# Patient Record
Sex: Female | Born: 2000 | Race: Black or African American | Hispanic: No | Marital: Single | Smoking: Never smoker
Health system: Southern US, Community
[De-identification: ages and names within clinical notes are randomized; demographics above are authoritative.]

---
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
## 2021-05-08 ENCOUNTER — Ambulatory Visit (HOSPITAL_COMMUNITY)
Admission: EM | Admit: 2021-05-08 | Discharge: 2021-05-08 | Disposition: A | Discharge status: Home / Self Care | Payer: 59 | Attending: Sports Medicine | Admitting: Sports Medicine

## 2021-05-08 ENCOUNTER — Other Ambulatory Visit: Payer: Self-pay

## 2021-05-08 ENCOUNTER — Encounter (HOSPITAL_COMMUNITY): Payer: Self-pay | Admitting: Emergency Medicine

## 2021-05-08 DIAGNOSIS — J020 Streptococcal pharyngitis: Secondary | ICD-10-CM | POA: Diagnosis not present

## 2021-05-08 MED ORDER — KETOROLAC TROMETHAMINE 60 MG/2ML IM SOLN
60.0000 mg | Freq: Once | INTRAMUSCULAR | Status: AC
  Administered 2021-05-08: 60 mg via INTRAMUSCULAR

## 2021-05-08 MED ORDER — KETOROLAC TROMETHAMINE 60 MG/2ML IM SOLN
INTRAMUSCULAR | Status: AC
  Filled 2021-05-08: qty 2

## 2021-05-08 MED ORDER — AMOXICILLIN 500 MG PO CAPS: 500.0000 mg | ORAL_CAPSULE | Freq: Two times a day (BID) | ORAL | 0 refills | Status: AC

## 2021-05-08 NOTE — ED Provider Notes (Signed)
?[facility] ? ? ? ?CSN: [id number] ?Arrival date & time: 05/08/21  4098 ? ? ?  ? ?History   ?Chief Complaint ?Chief Complaint  ?Patient presents with  ? Sore Throat  ? ? ?HPI ?Brandy Morse is a 21 y.o. female here for sore throat ? ? ?Sore Throat ?Pertinent negatives include no chest pain, no abdominal pain and no shortness of breath.  ? ?Sore throat since yesterday, very painful.  She feels like her tonsils are swollen. ?Has had some subjective fever and chills, no fever here today ?Painful to swallow ?R > L  ?No runny nose, stuff nose ?No chest pain or shortness of breath  ?Denies any known sick contacts ?She does report a few years ago she did have strep throat/tonsillitis ?Has been painful to eat and drink, although she is able to tolerate liquids ? ?Tx: none so far ? ?History reviewed. No pertinent past medical history. ? ?Patient Active Problem List  ? Diagnosis Date Noted  ? Wellness examination 10/12/2020  ? ? ?History reviewed. No pertinent surgical history. ? ?OB History   ?No obstetric history on file. ?  ? ? ? ?Home Medications   ? ?Prior to Admission medications   ?Medication Sig Start Date End Date Taking? Authorizing Provider  ?amoxicillin (AMOXIL) 500 MG capsule Take 1 capsule (500 mg total) by mouth 2 (two) times daily for 10 days. 05/08/21 05/18/21 Yes Madelyn Brunner, DO  ?hydroxypropyl methylcellulose / hypromellose (ISOPTO TEARS / GONIOVISC) 2.5 % ophthalmic solution Place 1 drop into the left eye 4 (four) times daily as needed for dry eyes. ?Patient not taking: Reported on 05/08/2021 12/19/20   Dartha Lodge, PA-C  ? ? ?Family History ?Family History  ?Problem Relation Age of Onset  ? Multiple sclerosis Father   ? ? ?Social History ?Social History  ? ?Tobacco Use  ? Smoking status: Never  ? Smokeless tobacco: Never  ?Vaping Use  ? Vaping Use: Never used  ?Substance Use Topics  ? Alcohol use: Not Currently  ? Drug use: Never  ? ? ? ?Allergies   ?Patient has no known allergies. ? ? ?Review of  Systems ?Review of Systems  ?Constitutional:  Positive for appetite change.  ?HENT:  Positive for sore throat and trouble swallowing. Negative for congestion, drooling, sneezing and voice change.   ?Respiratory:  Negative for cough and shortness of breath.   ?Cardiovascular:  Negative for chest pain.  ?Gastrointestinal:  Negative for abdominal pain, diarrhea, nausea and vomiting.  ?Musculoskeletal:  Negative for neck stiffness.  ?Skin:  Negative for rash.  ? ? ?Physical Exam ?Triage Vital Signs ?ED Triage Vitals  ?Enc Vitals Group  ?   BP 05/08/21 0848 121/81  ?   Pulse Rate 05/08/21 0848 78  ?   Resp 05/08/21 0848 18  ?   Temp 05/08/21 0848 98.6 ?F (37 ?C)  ?   Temp Source 05/08/21 0848 Oral  ?   SpO2 05/08/21 0848 98 %  ?   Weight --   ?   Height --   ?   Head Circumference --   ?   Peak Flow --   ?   Pain Score 05/08/21 0846 10  ?   Pain Loc --   ?   Pain Edu? --   ?   Excl. in GC? --   ? ?No data found. ? ?Updated Vital Signs ?BP 121/81 (BP Location: Right Arm)   Pulse 78   Temp 98.6 ?F (37 ?C) (Oral)  Resp 18   LMP 05/02/2021 (Exact Date)   SpO2 98%  ? ?Visual Acuity ?Right Eye Distance:   ?Left Eye Distance:   ?Bilateral Distance:   ? ?Right Eye Near:   ?Left Eye Near:    ?Bilateral Near:    ? ?Physical Exam ?Constitutional:   ?   Appearance: She is ill-appearing. She is not toxic-appearing.  ?HENT:  ?   Head: Normocephalic and atraumatic.  ?   Right Ear: Tympanic membrane and ear canal normal.  ?   Left Ear: Tympanic membrane and ear canal normal.  ?   Nose: No congestion.  ?   Mouth/Throat:  ?   Mouth: Mucous membranes are moist.  ?   Pharynx: Posterior oropharyngeal erythema present.  ?   Comments: + Bilateral tonsillar swelling and erythema ?Tonsils 2-3+ ?+ exudate noted on the right tonsil ?Eyes:  ?   Extraocular Movements: Extraocular movements intact.  ?   Pupils: Pupils are equal, round, and reactive to light.  ?Cardiovascular:  ?   Rate and Rhythm: Normal rate.  ?Pulmonary:  ?   Effort:  Pulmonary effort is normal. No respiratory distress.  ?Abdominal:  ?   General: Abdomen is flat.  ?   Palpations: Abdomen is soft.  ?Musculoskeletal:  ?   Cervical back: Neck supple. No rigidity.  ?Lymphadenopathy:  ?   Cervical: Cervical adenopathy present.  ?Skin: ?   General: Skin is warm.  ?   Capillary Refill: Capillary refill takes less than 2 seconds.  ?Neurological:  ?   Mental Status: She is alert.  ?Psychiatric:     ?   Mood and Affect: Mood normal.     ?   Thought Content: Thought content normal.  ? ? ? ?UC Treatments / Results  ?Labs ?(all labs ordered are listed, but only abnormal results are displayed) ?Labs Reviewed - No data to display ? ?EKG ? ? ?Radiology ?No results found. ? ?Procedures ?Procedures (including critical care time) ? ?Medications Ordered in UC ?Medications  ?ketorolac (TORADOL) injection 60 mg (60 mg Intramuscular Given 05/08/21 0934)  ? ? ?Initial Impression / Assessment and Plan / UC Course  ?I have reviewed the triage vital signs and the nursing notes. ? ?Pertinent labs & imaging results that were available during my care of the patient were reviewed by me and considered in my medical decision making (see chart for details). ? ?  ? ?Patient presents with significant sore throat indicative of strep pharyngitis since yesterday.  Bilateral tonsils swollen and erythematous.  She does have exudates noted on the right tonsil.  Did not feel rapid strep testing was warranted as her Centor score is 4.  We will treat prophylactically with amoxicillin 500 mg twice daily for 10 days.  Given patient's hesitancy with swallowing giving her pain, I did proceed with IM injection of Toradol 60 mg today here in the office.  She may use over-the-counter anti-inflammatories by mouth starting tomorrow.  Supportive treatment discussed for her sore throat.  Strict return precautions provided. Safe for discharge home. ? ?Final Clinical Impressions(s) / UC Diagnoses  ? ?Final diagnoses:  ?Strep pharyngitis   ?Strep throat  ? ? ? ?Discharge Instructions   ? ?  ?Take amoxicillin, 1 tablet twice a day for the next 10 days.  Be sure that you complete your entire antibiotic course. ? ?Things at home you can do for your sore throat: ?-May take ibuprofen or Motrin every 6-8 hours as needed ?-You can gargle warm salt  water, use honey, use cough drops or throat lozenges ? ? ? ? ?ED Prescriptions   ? ? Medication Sig Dispense Auth. Provider  ? amoxicillin (AMOXIL) 500 MG capsule Take 1 capsule (500 mg total) by mouth 2 (two) times daily for 10 days. 20 capsule Madelyn BrunnerBrooks, Tylisha Danis, DO  ? ?  ? ?PDMP not reviewed this encounter. ?  Madelyn Brunner?Tenae Graziosi, DO ?05/08/21 [id number] ? ?

## 2021-05-08 NOTE — ED Triage Notes (Addendum)
Right side of throat hurts with swallowing, talking, movement of head.  Patient reports sudden onset of pain last night ? ?Patient is tearful ?

## 2021-05-08 NOTE — Discharge Instructions (Addendum)
Take amoxicillin, 1 tablet twice a day for the next 10 days.  Be sure that you complete your entire antibiotic course. ? ?Things at home you can do for your sore throat: ?-May take ibuprofen or Motrin every 6-8 hours as needed ?-You can gargle warm salt water, use honey, use cough drops or throat lozenges ?

## 2021-05-10 ENCOUNTER — Ambulatory Visit (INDEPENDENT_AMBULATORY_CARE_PROVIDER_SITE_OTHER): Payer: 59 | Admitting: Family Medicine

## 2021-05-10 ENCOUNTER — Encounter (HOSPITAL_BASED_OUTPATIENT_CLINIC_OR_DEPARTMENT_OTHER): Payer: Self-pay | Admitting: Family Medicine

## 2021-05-10 DIAGNOSIS — J020 Streptococcal pharyngitis: Secondary | ICD-10-CM | POA: Insufficient documentation

## 2021-05-10 NOTE — Patient Instructions (Signed)
Strep Throat, Adult Strep throat is an infection of the throat. It is caused by germs (bacteria). Strep throat is common during the cold months of the year. It mostly affects children who are 5-20 years old. However, people of all ages can get it at any time of the year. This infection spreads from person to person through coughing, sneezing, or having close contact. What are the causes? This condition is caused by the Streptococcus pyogenes germ. What increases the risk? You care for young children. Children are more likely to get strep throat and may spread it to others. You go to crowded places. Germs can spread easily in such places. You kiss or touch someone who has strep throat. What are the signs or symptoms? Fever or chills. Redness, swelling, or pain in the tonsils or throat. Pain or trouble when swallowing. White or yellow spots on the tonsils or throat. Tender glands in the neck and under the jaw. Bad breath. Red rash all over the body. This is rare. How is this treated? Medicines that kill germs (antibiotics). Medicines that treat pain or fever. These include: Ibuprofen or acetaminophen. Aspirin, only for people who are over the age of 18. Cough drops. Throat sprays. Follow these instructions at home: Medicines  Take over-the-counter and prescription medicines only as told by your doctor. Take your antibiotic medicine as told by your doctor. Do not stop taking the antibiotic even if you start to feel better. Eating and drinking  If you have trouble swallowing, eat soft foods until your throat feels better. Drink enough fluid to keep your pee (urine) pale yellow. To help with pain, you may have: Warm fluids, such as soup and tea. Cold fluids, such as frozen desserts or popsicles. General instructions Rinse your mouth (gargle) with a salt-water mixture 3-4 times a day or as needed. To make a salt-water mixture, dissolve -1 tsp (3-6 g) of salt in 1 cup (237 mL) of warm  water. Rest as much as you can. Stay home from work or school until you have been taking antibiotics for 24 hours. Do not smoke or use any products that contain nicotine or tobacco. If you need help quitting, ask your doctor. Keep all follow-up visits. How is this prevented?  Do not share food, drinking cups, or personal items. They can cause the germs to spread. Wash your hands well with soap and water. Make sure that all people in your house wash their hands well. Have family members tested if they have a fever or a sore throat. They may need an antibiotic if they have strep throat. Contact a doctor if: You have swelling in your neck that keeps getting bigger. You get a rash, cough, or earache. You cough up a thick fluid that is green, yellow-brown, or bloody. You have pain that does not get better with medicine. Your symptoms get worse instead of getting better. You have a fever. Get help right away if: You vomit. You have a very bad headache. Your neck hurts or feels stiff. You have chest pain or are short of breath. You have drooling, very bad throat pain, or changes in your voice. Your neck is swollen, or the skin gets red and tender. Your mouth is dry, or you are peeing less than normal. You keep feeling more tired or have trouble waking up. Your joints are red or painful. These symptoms may be an emergency. Do not wait to see if the symptoms will go away. Get help right away. Call   your local emergency services (911 in the [place] Summary Strep throat is an infection of the throat. It is caused by germs (bacteria). This infection can spread from person to person through coughing, sneezing, or having close contact. Take your medicines, including antibiotics, as told by your doctor. Do not stop taking the antibiotic even if you start to feel better. To prevent the spread of germs, wash your hands well with soap and water. Have others do the same. Do not share food, drinking cups,  or personal items. Get help right away if you have a bad headache, chest pain, shortness of breath, a stiff or painful neck, or you vomit. This information is not intended to replace advice given to you by your health care provider. Make sure you discuss any questions you have with your health care provider. Document Revised: 04/26/2020 Document Reviewed: 04/26/2020 Elsevier Patient Education  2023 [employer]  

## 2021-05-10 NOTE — Progress Notes (Signed)
? ? ?  Procedures performed today:   ? ?None. ? ?Independent interpretation of notes and tests performed by another provider:  ? ?None. ? ?Brief History, Exam, Impression, and Recommendations:   ? ?BP 118/78   Pulse 82   Temp 97.8 ?F (36.6 ?C) (Oral)   Ht 5\' 6"  (1.676 m)   Wt 164 lb (74.4 kg)   LMP 05/02/2021 (Exact Date)   SpO2 98%   BMI 26.47 kg/m?  ? ?Strep throat ?Patient was recently seen at urgent care due to sore throat, was diagnosed with strep throat, did not have any testing at time and was empirically treated with amoxicillin.  She has been taking months, now for about 2 days.  She indicates that she does a sore throat currently, is wondering if she needs to be on a different antibiotic since she still has some symptoms.  She has seen ENT in the past and does have some questions regarding tonsillectomy. ?Today patient is in no acute distress, afebrile.  She does have some tonsillar inflammation with exudates noted along left tonsil ?Discussed with patient that amoxicillin is preferred treatment for strep throat ?Did discuss conservative measures which can be utilized to try and help control symptoms, also provided handout with this information ?She has seen ENT in the past, provided patient with information for office where she was seen last year so that she may call and schedule follow-up.  Discussed that indications for tonsillectomy will be further discussed with her by that provider ? ?Plan for follow-up as needed ? ? ?___________________________________________ ?Karas Pickerill de 05/04/2021, MD, ABFM, CAQSM ?Primary Care and Sports Medicine ?[facility] ?

## 2021-05-10 NOTE — Assessment & Plan Note (Signed)
Patient was recently seen at urgent care due to sore throat, was diagnosed with strep throat, did not have any testing at time and was empirically treated with amoxicillin.  She has been taking months, now for about 2 days.  She indicates that she does a sore throat currently, is wondering if she needs to be on a different antibiotic since she still has some symptoms.  She has seen ENT in the past and does have some questions regarding tonsillectomy. ?Today patient is in no acute distress, afebrile.  She does have some tonsillar inflammation with exudates noted along left tonsil ?Discussed with patient that amoxicillin is preferred treatment for strep throat ?Did discuss conservative measures which can be utilized to try and help control symptoms, also provided handout with this information ?She has seen ENT in the past, provided patient with information for office where she was seen last year so that she may call and schedule follow-up.  Discussed that indications for tonsillectomy will be further discussed with her by that provider ?

## 2021-05-15 ENCOUNTER — Telehealth (HOSPITAL_BASED_OUTPATIENT_CLINIC_OR_DEPARTMENT_OTHER): Payer: Self-pay | Admitting: Family Medicine

## 2021-05-15 NOTE — Telephone Encounter (Signed)
Pt called office to check on status of referral to ENT. When trying to schedule with their office, she was told that no referral was placed. Advised pt to call ENT again and ask to schedule a follow-up from her previous appt since she is an established pt. Also asked pt to call back if they needed another referral to see her. ?

## 2021-05-16 NOTE — Telephone Encounter (Signed)
Pt called again today 5/2 regarding a referral for ENT.  Looked over notes and spoke with provider that pt was already sen a year ago by [facility] Dr. Wilburn Cornelia on 06/07/20, and he wanted to f/u with pt it stated in his notes pt has a upcoming appt for 06/27/21. Tried calling pt to let her know there wasn't a referral place since she has been to that office in the last 3 years and is an established pt, she should be able to call and make an appt. If the office requires her to have another referral to have her let our office know and we can put that in for her. ? Will send this in [place] message as well. ?Please advise. ?

## 2021-05-16 NOTE — Telephone Encounter (Signed)
Pt called back and relied message to pt and she showed understanding. ?

## 2021-07-04 ENCOUNTER — Other Ambulatory Visit: Payer: Self-pay | Admitting: Otolaryngology

## 2021-07-13 ENCOUNTER — Other Ambulatory Visit: Payer: Self-pay

## 2021-07-13 ENCOUNTER — Encounter (HOSPITAL_BASED_OUTPATIENT_CLINIC_OR_DEPARTMENT_OTHER): Payer: Self-pay | Admitting: Otolaryngology

## 2021-07-28 ENCOUNTER — Encounter (HOSPITAL_BASED_OUTPATIENT_CLINIC_OR_DEPARTMENT_OTHER)
Admission: RE | Disposition: A | Discharge status: Home / Self Care | Payer: Self-pay | Source: Home / Self Care | Attending: Otolaryngology

## 2021-07-28 ENCOUNTER — Ambulatory Visit (HOSPITAL_BASED_OUTPATIENT_CLINIC_OR_DEPARTMENT_OTHER): Payer: 59 | Admitting: Anesthesiology

## 2021-07-28 ENCOUNTER — Encounter (HOSPITAL_BASED_OUTPATIENT_CLINIC_OR_DEPARTMENT_OTHER): Payer: Self-pay | Admitting: Otolaryngology

## 2021-07-28 ENCOUNTER — Ambulatory Visit (HOSPITAL_BASED_OUTPATIENT_CLINIC_OR_DEPARTMENT_OTHER)
Admission: RE | Admit: 2021-07-28 | Discharge: 2021-07-28 | Disposition: A | Discharge status: Home / Self Care | Payer: 59 | Attending: Otolaryngology | Admitting: Otolaryngology

## 2021-07-28 ENCOUNTER — Other Ambulatory Visit: Payer: Self-pay

## 2021-07-28 DIAGNOSIS — J0391 Acute recurrent tonsillitis, unspecified: Secondary | ICD-10-CM | POA: Insufficient documentation

## 2021-07-28 DIAGNOSIS — J020 Streptococcal pharyngitis: Secondary | ICD-10-CM

## 2021-07-28 HISTORY — PX: TONSILLECTOMY AND ADENOIDECTOMY: SHX28

## 2021-07-28 LAB — POCT PREGNANCY, URINE: Preg Test, Ur: NEGATIVE

## 2021-07-28 SURGERY — TONSILLECTOMY AND ADENOIDECTOMY
Anesthesia: General | Site: Throat | Laterality: Bilateral

## 2021-07-28 MED ORDER — HYDROCODONE-ACETAMINOPHEN 7.5-325 MG/15ML PO SOLN
ORAL | Status: AC
  Filled 2021-07-28: qty 15

## 2021-07-28 MED ORDER — LIDOCAINE 2% (20 MG/ML) 5 ML SYRINGE
INTRAMUSCULAR | Status: AC
  Filled 2021-07-28: qty 5

## 2021-07-28 MED ORDER — DEXTROSE IN LACTATED RINGERS 5 % IV SOLN
INTRAVENOUS | Status: DC
Start: 2021-07-28 — End: 2021-07-28

## 2021-07-28 MED ORDER — HYDROMORPHONE HCL 1 MG/ML IJ SOLN
0.2500 mg | INTRAMUSCULAR | Status: DC | PRN
  Administered 2021-07-28 (×2): 0.5 mg via INTRAVENOUS

## 2021-07-28 MED ORDER — ROCURONIUM BROMIDE 100 MG/10ML IV SOLN
INTRAVENOUS | Status: DC | PRN
  Administered 2021-07-28: 30 mg via INTRAVENOUS

## 2021-07-28 MED ORDER — ONDANSETRON HCL 4 MG/2ML IJ SOLN
INTRAMUSCULAR | Status: AC
  Filled 2021-07-28: qty 2

## 2021-07-28 MED ORDER — ACETAMINOPHEN 500 MG PO TABS
1000.0000 mg | ORAL_TABLET | Freq: Once | ORAL | Status: AC
Start: 2021-07-28 — End: 2021-07-28
  Administered 2021-07-28: 1000 mg via ORAL

## 2021-07-28 MED ORDER — ONDANSETRON HCL 4 MG/2ML IJ SOLN
INTRAMUSCULAR | Status: DC | PRN
  Administered 2021-07-28: 4 mg via INTRAVENOUS

## 2021-07-28 MED ORDER — LACTATED RINGERS IV SOLN: INTRAVENOUS | Status: DC

## 2021-07-28 MED ORDER — IBUPROFEN 100 MG/5ML PO SUSP: 600.0000 mg | Freq: Four times a day (QID) | ORAL | Status: DC | PRN

## 2021-07-28 MED ORDER — PROPOFOL 10 MG/ML IV BOLUS
INTRAVENOUS | Status: AC
Start: 2021-07-28 — End: ?
  Filled 2021-07-28: qty 20

## 2021-07-28 MED ORDER — SUGAMMADEX SODIUM 200 MG/2ML IV SOLN
INTRAVENOUS | Status: DC | PRN
  Administered 2021-07-28: 200 mg via INTRAVENOUS

## 2021-07-28 MED ORDER — LIDOCAINE HCL (CARDIAC) PF 100 MG/5ML IV SOSY
PREFILLED_SYRINGE | INTRAVENOUS | Status: DC | PRN
  Administered 2021-07-28: 100 mg via INTRAVENOUS

## 2021-07-28 MED ORDER — HYDROMORPHONE HCL 1 MG/ML IJ SOLN
INTRAMUSCULAR | Status: AC
  Filled 2021-07-28: qty 0.5

## 2021-07-28 MED ORDER — DEXAMETHASONE SODIUM PHOSPHATE 4 MG/ML IJ SOLN
INTRAMUSCULAR | Status: DC | PRN
  Administered 2021-07-28: 10 mg via INTRAVENOUS

## 2021-07-28 MED ORDER — DEXAMETHASONE SODIUM PHOSPHATE 10 MG/ML IJ SOLN
INTRAMUSCULAR | Status: AC
  Filled 2021-07-28: qty 1

## 2021-07-28 MED ORDER — FENTANYL CITRATE (PF) 100 MCG/2ML IJ SOLN
INTRAMUSCULAR | Status: DC | PRN
  Administered 2021-07-28: 100 ug via INTRAVENOUS

## 2021-07-28 MED ORDER — PROPOFOL 10 MG/ML IV BOLUS
INTRAVENOUS | Status: DC | PRN
  Administered 2021-07-28: 140 mg via INTRAVENOUS

## 2021-07-28 MED ORDER — ONDANSETRON HCL 4 MG PO TABS: 4.0000 mg | ORAL_TABLET | Freq: Three times a day (TID) | ORAL | Status: DC

## 2021-07-28 MED ORDER — HYDROCODONE-ACETAMINOPHEN 7.5-325 MG/15ML PO SOLN
10.0000 mL | Freq: Four times a day (QID) | ORAL | Status: DC | PRN
  Administered 2021-07-28: 10 mL via ORAL

## 2021-07-28 MED ORDER — DEXAMETHASONE SODIUM PHOSPHATE 10 MG/ML IJ SOLN
10.0000 mg | Freq: Once | INTRAMUSCULAR | Status: AC
  Administered 2021-07-28: 10 mg via INTRAVENOUS

## 2021-07-28 MED ORDER — 0.9 % SODIUM CHLORIDE (POUR BTL) OPTIME
TOPICAL | Status: DC | PRN
  Administered 2021-07-28: 75 mL

## 2021-07-28 MED ORDER — DEXAMETHASONE SODIUM PHOSPHATE 10 MG/ML IJ SOLN: 10.0000 mg | Freq: Once | INTRAMUSCULAR | Status: DC

## 2021-07-28 MED ORDER — MIDAZOLAM HCL 5 MG/5ML IJ SOLN
INTRAMUSCULAR | Status: DC | PRN
  Administered 2021-07-28: 2 mg via INTRAVENOUS

## 2021-07-28 MED ORDER — HYDROCODONE-ACETAMINOPHEN 7.5-325 MG/15ML PO SOLN: ORAL | 0 refills | Status: DC

## 2021-07-28 MED ORDER — MIDAZOLAM HCL 2 MG/2ML IJ SOLN
INTRAMUSCULAR | Status: AC
  Filled 2021-07-28: qty 2

## 2021-07-28 MED ORDER — FENTANYL CITRATE (PF) 100 MCG/2ML IJ SOLN
INTRAMUSCULAR | Status: AC
  Filled 2021-07-28: qty 2

## 2021-07-28 MED ORDER — AMOXICILLIN-POT CLAVULANATE 250-62.5 MG/5ML PO SUSR: 500.0000 mg | Freq: Two times a day (BID) | ORAL | 0 refills | Status: AC

## 2021-07-28 MED ORDER — PHENOL 1.4 % MT LIQD
1.0000 | OROMUCOSAL | Status: DC | PRN
  Administered 2021-07-28: 1 via OROMUCOSAL

## 2021-07-28 MED ORDER — CEFAZOLIN SODIUM-DEXTROSE 2-4 GM/100ML-% IV SOLN
2000.0000 mg | Freq: Once | INTRAVENOUS | Status: AC
  Administered 2021-07-28: 2000 mg via INTRAVENOUS

## 2021-07-28 MED ORDER — ACETAMINOPHEN 500 MG PO TABS
ORAL_TABLET | ORAL | Status: AC
  Filled 2021-07-28: qty 2

## 2021-07-28 MED ORDER — ONDANSETRON HCL 4 MG/2ML IJ SOLN: 4.0000 mg | Freq: Three times a day (TID) | INTRAMUSCULAR | Status: DC

## 2021-07-28 SURGICAL SUPPLY — 32 items
CANISTER SUCT 1200ML W/VALVE (MISCELLANEOUS) ×2 IMPLANT
CATH ROBINSON RED A/P 10FR (CATHETERS) ×1 IMPLANT
COAGULATOR SUCT SWTCH 10FR 6 (ELECTROSURGICAL) ×2 IMPLANT
COVER BACK TABLE 60X90IN (DRAPES) ×2 IMPLANT
COVER MAYO STAND STRL (DRAPES) ×2 IMPLANT
DEFOGGER MIRROR 1QT (MISCELLANEOUS) IMPLANT
ELECT COATED BLADE 2.86 ST (ELECTRODE) ×2 IMPLANT
ELECT REM PT RETURN 9FT ADLT (ELECTROSURGICAL) ×2
ELECT REM PT RETURN 9FT PED (ELECTROSURGICAL)
ELECTRODE REM PT RETRN 9FT PED (ELECTROSURGICAL) IMPLANT
ELECTRODE REM PT RTRN 9FT ADLT (ELECTROSURGICAL) IMPLANT
GAUZE SPONGE 4X4 12PLY STRL LF (GAUZE/BANDAGES/DRESSINGS) ×2 IMPLANT
GLOVE BIOGEL M 7.0 STRL (GLOVE) ×2 IMPLANT
GLOVE BIOGEL PI IND STRL 7.5 (GLOVE) IMPLANT
GLOVE BIOGEL PI INDICATOR 7.5 (GLOVE) ×1
GOWN STRL REUS W/ TWL LRG LVL3 (GOWN DISPOSABLE) ×2 IMPLANT
GOWN STRL REUS W/ TWL XL LVL3 (GOWN DISPOSABLE) IMPLANT
GOWN STRL REUS W/TWL LRG LVL3 (GOWN DISPOSABLE) ×2
GOWN STRL REUS W/TWL XL LVL3 (GOWN DISPOSABLE) ×2
MARKER SKIN DUAL TIP RULER LAB (MISCELLANEOUS) IMPLANT
NS IRRIG 1000ML POUR BTL (IV SOLUTION) ×2 IMPLANT
PENCIL SMOKE EVACUATOR (MISCELLANEOUS) ×2 IMPLANT
PIN SAFETY STERILE (MISCELLANEOUS) ×1 IMPLANT
SHEET MEDIUM DRAPE 40X70 STRL (DRAPES) ×2 IMPLANT
SPONGE TONSIL 1 RF SGL (DISPOSABLE) IMPLANT
SPONGE TONSIL 1.25 RF SGL STRG (GAUZE/BANDAGES/DRESSINGS) ×1 IMPLANT
SYR BULB EAR ULCER 3OZ GRN STR (SYRINGE) ×2 IMPLANT
TOWEL GREEN STERILE FF (TOWEL DISPOSABLE) ×2 IMPLANT
TUBE CONNECTING 20X1/4 (TUBING) ×2 IMPLANT
TUBE SALEM SUMP 12R W/ARV (TUBING) IMPLANT
TUBE SALEM SUMP 16 FR W/ARV (TUBING) ×1 IMPLANT
YANKAUER SUCT BULB TIP NO VENT (SUCTIONS) ×2 IMPLANT

## 2021-07-28 NOTE — Transfer of Care (Signed)
Immediate Anesthesia Transfer of Care Note  Patient: Brandy Morse  Procedure(s) Performed: TONSILLECTOMY ADENOIDECTOMY (Bilateral: Throat)  Patient Location: PACU  Anesthesia Type:General  Level of Consciousness: awake, alert  and oriented  Airway & Oxygen Therapy: Patient Spontanous Breathing and Patient connected to face mask oxygen  Post-op Assessment: Report given to RN and Post -op Vital signs reviewed and stable  Post vital signs: Reviewed and stable  Last Vitals:  Vitals Value Taken Time  BP    Temp    Pulse    Resp    SpO2      Last Pain:  Vitals:   07/28/21 1002  TempSrc: Oral  PainSc: 0-No pain         Complications: No notable events documented.

## 2021-07-28 NOTE — H&P (Signed)
Brandy Morse is an 21 y.o. female.   Chief Complaint: Recurrent tonsillitis HPI: Patient with a history of recurrent tonsillitis and sore throat.    History reviewed. No pertinent past medical history.  History reviewed. No pertinent surgical history.  Family History  Problem Relation Age of Onset   Multiple sclerosis Father    Social History:  reports that she has never smoked. She has never used smokeless tobacco. She reports that she does not currently use alcohol. She reports that she does not use drugs.  Allergies: No Known Allergies  No medications prior to admission.    Results for orders placed or performed during the hospital encounter of 07/28/21 (from the past 48 hour(s))  Pregnancy, urine POC     Status: None   Collection Time: 07/28/21  9:45 AM  Result Value Ref Range   Preg Test, Ur NEGATIVE NEGATIVE    Comment:        THE SENSITIVITY OF THIS METHODOLOGY IS >24 mIU/mL    No results found.  Review of Systems  HENT:  Positive for sore throat.     Blood pressure 116/76, pulse 61, temperature (!) 97.5 F (36.4 C), temperature source Oral, resp. rate 15, height 5\' 6"  (1.676 m), weight 77 kg, last menstrual period 06/25/2021, SpO2 100 %. Physical Exam Constitutional:      Appearance: She is normal weight.  HENT:     Mouth/Throat:     Comments: Tonsil hypertrophy Cardiovascular:     Rate and Rhythm: Normal rate.  Pulmonary:     Effort: Pulmonary effort is normal.  Musculoskeletal:     Cervical back: Normal range of motion.  Neurological:     Mental Status: She is alert.      Assessment/Plan Patient admitted for outpatient surgery under general anesthesia consisting of tonsillectomy and possible adenoidectomy.  08/25/2021, MD 07/28/2021, 11:11 AM

## 2021-07-28 NOTE — Discharge Instructions (Signed)
May take Tylenol after 4pm, if needed.   Post Anesthesia Home Care Instructions  Activity: Get plenty of rest for the remainder of the day. A responsible individual must stay with you for 24 hours following the procedure.  For the next 24 hours, DO NOT: -Drive a car -Operate machinery -Drink alcoholic beverages -Take any medication unless instructed by your physician -Make any legal decisions or sign important papers.  Meals: Start with liquid foods such as gelatin or soup. Progress to regular foods as tolerated. Avoid greasy, spicy, heavy foods. If nausea and/or vomiting occur, drink only clear liquids until the nausea and/or vomiting subsides. Call your physician if vomiting continues.  Special Instructions/Symptoms: Your throat may feel dry or sore from the anesthesia or the breathing tube placed in your throat during surgery. If this causes discomfort, gargle with warm salt water. The discomfort should disappear within 24 hours.  If you had a scopolamine patch placed behind your ear for the management of post- operative nausea and/or vomiting:  1. The medication in the patch is effective for 72 hours, after which it should be removed.  Wrap patch in a tissue and discard in the trash. Wash hands thoroughly with soap and water. 2. You may remove the patch earlier than 72 hours if you experience unpleasant side effects which may include dry mouth, dizziness or visual disturbances. 3. Avoid touching the patch. Wash your hands with soap and water after contact with the patch.      

## 2021-07-28 NOTE — Op Note (Signed)
Operative Note: Tonsillectomy  Patient: Brandy Morse  Medical record number: [id number]  Date:07/28/2021  Pre-operative Indications: Recurrent tonsillitis  Postoperative Indications: Same  Surgical Procedure: Tonsillectomy  Anesthesia: GET  Surgeon: Barbee Cough, M.D.  Complications: None  EBL: Minimal   Brief History: The patient is a 21 y.o. female with a history of recurrent acute tonsillitis and tonsillar hypertrophy. The patient has been on multiple courses of antibiotics for recurrent infection. Based on the patient's history and findings I recommended tonsillectomy under general anesthesia, risks and benefits were discussed in detail with the patient and family. They understand and agree with our plan for surgery which is scheduled on elective basis at [facility].  Surgical Procedure: The patient is brought to the operating room on 07/28/2021 and placed in supine position on the operating table. General endotracheal anesthesia was established without difficulty. When the patient was adequately anesthetized, surgical timeout was performed and correct identification of the patient and the surgical procedure. The patient was positioned and prepped and draped in sterile fashion.  With the patient prepared for surgery a Lisabeth Register mouth gag was inserted without difficulty, there were no loose or broken teeth and the hard soft palate were intact. Tonsillectomy was then performed, using Bovie cautery and dissecting in a subcapsular fashion the entire left tonsil was removed from superior pole to tongue base. Right tonsil removed in a similar fashion.  Tonsil tissue was sent to pathology for gross and microscopic evaluation.  The tonsillar fossa were gently abraded with dry tonsil sponge and several small areas of point hemorrhage were cauterized with suction cautery. The Crowe-Davis mouth gag was released and reapplied there is no active bleeding. Oral cavity and nasopharynx were irrigated  with saline. An orogastric tube was passed and stomach contents were aspirated. Mouthgag was removed, again no loose or broken teeth and no bleeding. Patient was awakened from anesthetic and extubated, then transferred from the operating room to the recovery room in stable condition. There were no complications and blood loss was minimal.   Barbee Cough, M.D. [facility] 07/28/2021

## 2021-07-28 NOTE — Anesthesia Postprocedure Evaluation (Signed)
Anesthesia Post Note  Patient: Brandy Morse  Procedure(s) Performed: TONSILLECTOMY ADENOIDECTOMY (Bilateral: Throat)     Patient location during evaluation: PACU Anesthesia Type: General Level of consciousness: awake and alert Pain management: pain level controlled Vital Signs Assessment: post-procedure vital signs reviewed and stable Respiratory status: spontaneous breathing, nonlabored ventilation and respiratory function stable Cardiovascular status: blood pressure returned to baseline and stable Postop Assessment: no apparent nausea or vomiting Anesthetic complications: no   No notable events documented.  Last Vitals:  Vitals:   07/28/21 1245 07/28/21 1300  BP: 105/82 112/83  Pulse: 61 67  Resp: 14 15  Temp:    SpO2: 98% 99%    Last Pain:  Vitals:   07/28/21 1245  TempSrc:   PainSc: Asleep                 Sheliah Fiorillo,W. EDMOND

## 2021-07-28 NOTE — Anesthesia Preprocedure Evaluation (Addendum)
Anesthesia Evaluation  Patient identified by MRN, date of birth, ID band Patient awake    Reviewed: Allergy & Precautions, H&P , NPO status , Patient's Chart, lab work & pertinent test results  Airway Mallampati: I  TM Distance: >3 FB Neck ROM: Full    Dental no notable dental hx. (+) Teeth Intact, Dental Advisory Given   Pulmonary neg pulmonary ROS,    Pulmonary exam normal breath sounds clear to auscultation       Cardiovascular negative cardio ROS   Rhythm:Regular Rate:Normal     Neuro/Psych negative neurological ROS  negative psych ROS   GI/Hepatic negative GI ROS, Neg liver ROS,   Endo/Other  negative endocrine ROS  Renal/GU negative Renal ROS  negative genitourinary   Musculoskeletal   Abdominal   Peds  Hematology negative hematology ROS (+)   Anesthesia Other Findings   Reproductive/Obstetrics negative OB ROS                            Anesthesia Physical Anesthesia Plan  ASA: 1  Anesthesia Plan: General   Post-op Pain Management: Tylenol PO (pre-op)*   Induction: Intravenous  PONV Risk Score and Plan: 4 or greater and Ondansetron, Dexamethasone and Midazolam  Airway Management Planned: Oral ETT  Additional Equipment:   Intra-op Plan:   Post-operative Plan: Extubation in OR  Informed Consent: I have reviewed the patients History and Physical, chart, labs and discussed the procedure including the risks, benefits and alternatives for the proposed anesthesia with the patient or authorized representative who has indicated his/her understanding and acceptance.     Dental advisory given  Plan Discussed with: CRNA  Anesthesia Plan Comments:         Anesthesia Quick Evaluation

## 2021-07-31 ENCOUNTER — Encounter (HOSPITAL_BASED_OUTPATIENT_CLINIC_OR_DEPARTMENT_OTHER): Payer: Self-pay | Admitting: Otolaryngology

## 2021-07-31 LAB — SURGICAL PATHOLOGY

## 2021-10-13 ENCOUNTER — Encounter (HOSPITAL_BASED_OUTPATIENT_CLINIC_OR_DEPARTMENT_OTHER): Payer: 59 | Admitting: Family Medicine

## 2021-10-24 ENCOUNTER — Encounter (HOSPITAL_BASED_OUTPATIENT_CLINIC_OR_DEPARTMENT_OTHER): Payer: Self-pay

## 2021-10-24 ENCOUNTER — Encounter (HOSPITAL_BASED_OUTPATIENT_CLINIC_OR_DEPARTMENT_OTHER): Payer: 59 | Admitting: Family Medicine

## 2021-11-16 ENCOUNTER — Encounter (HOSPITAL_BASED_OUTPATIENT_CLINIC_OR_DEPARTMENT_OTHER): Payer: 59 | Admitting: Family Medicine

## 2022-01-23 ENCOUNTER — Encounter (HOSPITAL_BASED_OUTPATIENT_CLINIC_OR_DEPARTMENT_OTHER): Payer: Self-pay | Admitting: Family Medicine

## 2022-01-24 ENCOUNTER — Encounter (HOSPITAL_BASED_OUTPATIENT_CLINIC_OR_DEPARTMENT_OTHER): Payer: Self-pay | Admitting: Family Medicine

## 2022-02-13 ENCOUNTER — Ambulatory Visit (INDEPENDENT_AMBULATORY_CARE_PROVIDER_SITE_OTHER): Payer: 59 | Admitting: Family Medicine

## 2022-02-13 ENCOUNTER — Encounter (HOSPITAL_BASED_OUTPATIENT_CLINIC_OR_DEPARTMENT_OTHER): Payer: Self-pay | Admitting: Family Medicine

## 2022-02-13 VITALS — BP 112/75 | HR 67 | Ht 66.0 in | Wt 183.1 lb

## 2022-02-13 DIAGNOSIS — Z23 Encounter for immunization: Secondary | ICD-10-CM

## 2022-02-13 DIAGNOSIS — Z0000 Encounter for general adult medical examination without abnormal findings: Secondary | ICD-10-CM | POA: Diagnosis not present

## 2022-02-13 NOTE — Progress Notes (Signed)
Complete physical exam  Patient: Brandy Morse   DOB: [birth year]   22 y.o. Female  MRN: [id number]  Subjective:    Chief Complaint  Patient presents with   Annual Exam    Pt here for annual exam     Brandy Morse is a 22 y.o. female who presents today for a complete physical exam. She reports consuming a  avoids red meat, fruits and vegetables  diet.  Plays sports, interval training  She generally feels well. She reports sleeping well. She does not have additional problems to discuss today.    Most recent fall risk assessment:    02/13/2022    2:20 PM  [place] in the past year? 0  Number falls in past yr: 0  Injury with Fall? 0  Risk for fall due to : No Fall Risks  Follow up Falls evaluation completed     Most recent depression screenings:    02/13/2022    2:20 PM 05/10/2021   10:24 AM  PHQ 2/9 Scores  PHQ - 2 Score 0 0  PHQ- 9 Score 0   Exception Documentation Medical reason Medical reason    Dental visit twice per year.  Has had an eye exam, yearly. Does not wear glasses.     Patient Care Team: [place], Blondell Reveal, MD as PCP - General (Family Medicine)   Outpatient Medications Prior to Visit  Medication Sig   [DISCONTINUED] HYDROcodone-acetaminophen (HYCET) 7.5-325 mg/15 ml solution 2 to 3 teaspoons every 12 hours as needed for pain management.  Alternate ibuprofen 600 mg every 6 hours.   No facility-administered medications prior to visit.    Review of Systems  Respiratory:  Negative for shortness of breath.   Cardiovascular:  Negative for chest pain.  Gastrointestinal:  Negative for abdominal pain, nausea and vomiting.  Genitourinary:  Negative for dysuria.  Neurological:  Negative for dizziness and headaches.  Psychiatric/Behavioral:  Negative for depression.           Objective:     BP 112/75 (BP Location: Right Arm, Patient Position: Sitting, Cuff Size: Normal)   Pulse 67   Ht 5\' 6"  (1.676 m)   Wt 183 lb 1.6 oz (83.1 kg)   SpO2  100%   BMI 29.55 kg/m    Physical Exam Vitals and nursing note reviewed.  Constitutional:      General: She is not in acute distress.    Appearance: Normal appearance.  Neck:     Thyroid: No thyroid tenderness.  Cardiovascular:     Rate and Rhythm: Normal rate and regular rhythm.     Pulses: Normal pulses.     Heart sounds: Normal heart sounds.  Pulmonary:     Effort: Pulmonary effort is normal.     Breath sounds: Normal breath sounds.  Lymphadenopathy:     Cervical:     Right cervical: No superficial cervical adenopathy.    Left cervical: No superficial cervical adenopathy.  Skin:    General: Skin is warm and dry.     Capillary Refill: Capillary refill takes less than 2 seconds.  Neurological:     General: No focal deficit present.     Mental Status: She is alert. Mental status is at baseline.  Psychiatric:        Mood and Affect: Mood normal.        Behavior: Behavior normal.        Thought Content: Thought content normal.  Judgment: Judgment normal.      No results found for any visits on 02/13/22.     Assessment & Plan:    Routine Health Maintenance and Physical Exam  Immunization History  Administered Date(s) Administered   HPV 9-valent 02/13/2022   Influenza,inj,Quad PF,6+ Mos 02/13/2022    Health Maintenance  Topic Date Due   DTaP/Tdap/Td (1 - Tdap) Never done   HPV VACCINES (2 - 3-dose series) 03/13/2022   COVID-19 Vaccine (1) 03/01/2022 (Originally 10/16/2001)   [employer]  02/14/2023 (Originally 04/16/2016)   Hepatitis C Screening  02/14/2023 (Originally 04/17/2019)   HIV Screening  02/14/2023 (Originally 04/16/2016)   INFLUENZA VACCINE  Completed    Discussed health benefits of physical activity, and encouraged her to engage in regular exercise appropriate for her age and condition.  Problem List Items Addressed This Visit     Annual physical exam - Primary  Routine labs reviewed. HCM reviewed/discussed. Anticipatory guidance  regarding healthy weight, lifestyle and choices given. Recommend healthy diet.  Recommend approximately 150 minutes/week of moderate intensity exercise. Limit alcohol consumption: no more than one drink per day for women and 2 drinks per day for me. Recommend regular dental and vision exams. Always use seatbelt/lap and shoulder restraints. Recommend using smoke alarms and checking batteries at least twice a year. Recommend using sunscreen when outside. Discussed colon cancer screening recommendations. Received seasonal influenza vaccine.  Started HPV vaccine series today.   Return in about 1 year (around 02/14/2023) for cpe with pap.     Chalmers Guest, FNP

## 2022-02-14 ENCOUNTER — Encounter (HOSPITAL_BASED_OUTPATIENT_CLINIC_OR_DEPARTMENT_OTHER): Payer: Commercial Managed Care - PPO | Admitting: Family Medicine

## 2022-03-12 ENCOUNTER — Ambulatory Visit (HOSPITAL_COMMUNITY)
Admission: EM | Admit: 2022-03-12 | Discharge: 2022-03-12 | Disposition: A | Discharge status: Home / Self Care | Payer: 59 | Attending: Internal Medicine | Admitting: Internal Medicine

## 2022-03-12 ENCOUNTER — Encounter (HOSPITAL_COMMUNITY): Payer: Self-pay

## 2022-03-12 DIAGNOSIS — M25561 Pain in right knee: Secondary | ICD-10-CM

## 2022-03-12 MED ORDER — IBUPROFEN 800 MG PO TABS
ORAL_TABLET | ORAL | Status: AC
  Filled 2022-03-12: qty 1

## 2022-03-12 MED ORDER — IBUPROFEN 800 MG PO TABS
800.0000 mg | ORAL_TABLET | Freq: Once | ORAL | Status: AC
  Administered 2022-03-12: 800 mg via ORAL

## 2022-03-12 MED ORDER — NAPROXEN 500 MG PO TABS: 500.0000 mg | ORAL_TABLET | Freq: Two times a day (BID) | ORAL | 0 refills | Status: DC

## 2022-03-12 NOTE — ED Triage Notes (Signed)
Pt is here for right knee pain x 1day

## 2022-03-12 NOTE — Discharge Instructions (Addendum)
Take naproxen twice daily for the next 2 to 3 days, then as needed to reduce swelling and inflammation to the right knee.  Take medicine with food to avoid stomach upset.  Wear the knee brace as needed for compression and stability to the right knee. Apply ice to the knee 20 minutes on 20 minutes off to reduce inflammation and pain. Follow-up with your PCP as scheduled next Tuesday.   If you develop any new or worsening symptoms or do not improve in the next 2 to 3 days, please return.  If your symptoms are severe, please go to the emergency room.  Follow-up with your primary care provider for further evaluation and management of your symptoms as well as ongoing wellness visits.  I hope you feel better!

## 2022-03-12 NOTE — ED Provider Notes (Signed)
[facility]    CSN: [id number] Arrival date & time: 03/12/22  1806      History   Chief Complaint Chief Complaint  Patient presents with   Knee Pain    HPI Brandy Morse is a 22 y.o. female.   Patient presents to urgent care for evaluation of right-sided generalized anterior knee pain that started last night while she was laying down in the bed and worsened this morning upon awakening.  No recent trauma/fall/injury to the right knee.  No history of trauma or surgical procedures to the right knee.  She states that the right knee feels "tight" and she is experiencing "pressure" to the right knee when standing.  No calf pain, knee swelling, redness, warmth, or right hip/right ankle pain.  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  She works out but denies recent heavy lifting or changes in physical activity.  She has not attempted use of any over-the-counter medications to help with knee pain and states that it hurts worse when she puts weight on her right lower extremity.   Knee Pain   History reviewed. No pertinent past medical history.  Patient Active Problem List   Diagnosis Date Noted   Strep throat 05/10/2021   Annual physical exam 10/12/2020    Past Surgical History:  Procedure Laterality Date   TONSILLECTOMY AND ADENOIDECTOMY Bilateral 07/28/2021   Procedure: TONSILLECTOMY ADENOIDECTOMY;  Surgeon: Jerrell Belfast, MD;  Location: [facility];  Service: ENT;  Laterality: Bilateral;    OB History   No obstetric history on file.      Home Medications    Prior to Admission medications   Not on File    Family History Family History  Problem Relation Age of Onset   Multiple sclerosis Father     Social History Social History   Tobacco Use   Smoking status: Never   Smokeless tobacco: Never  Vaping Use   Vaping Use: Never used  Substance Use Topics   Alcohol use: Not Currently   Drug use: Never      Allergies   Patient has no known allergies.   Review of Systems Review of Systems Per HPI  Physical Exam Triage Vital Signs ED Triage Vitals  Enc Vitals Group     BP 03/12/22 1948 110/73     Pulse Rate 03/12/22 1948 86     Resp 03/12/22 1948 16     Temp 03/12/22 1948 98.5 F (36.9 C)     Temp Source 03/12/22 1948 Oral     SpO2 03/12/22 1948 100 %     Weight --      Height --      Head Circumference --      Peak Flow --      Pain Score 03/12/22 1947 9     Pain Loc --      Pain Edu? --      Excl. in [place]? --    No data found.  Updated Vital Signs BP 110/73 (BP Location: Right Arm)   Pulse 86   Temp 98.5 F (36.9 C) (Oral)   Resp 16   LMP 02/24/2022   SpO2 100%   Visual Acuity Right Eye Distance:   Left Eye Distance:   Bilateral Distance:    Right Eye Near:   Left Eye Near:    Bilateral Near:     Physical Exam   Generalized pain to the patella and medial/lateral joint lines  UC Treatments / Results  Labs (all labs ordered are listed, but only abnormal results are displayed) Labs Reviewed - No data to display  EKG   Radiology No results found.  Procedures Procedures (including critical care time)  Medications Ordered in UC Medications - No data to display  Initial Impression / Assessment and Plan / UC Course  I have reviewed the triage vital signs and the nursing notes.  Pertinent labs & imaging results that were available during my care of the patient were reviewed by me and considered in my medical decision making (see chart for details).     *** Final Clinical Impressions(s) / UC Diagnoses   Final diagnoses:  None   Discharge Instructions   None    ED Prescriptions   None    PDMP not reviewed this encounter.

## 2022-03-22 ENCOUNTER — Ambulatory Visit (INDEPENDENT_AMBULATORY_CARE_PROVIDER_SITE_OTHER): Payer: PRIVATE HEALTH INSURANCE

## 2022-03-22 ENCOUNTER — Ambulatory Visit (INDEPENDENT_AMBULATORY_CARE_PROVIDER_SITE_OTHER): Payer: PRIVATE HEALTH INSURANCE | Admitting: Family Medicine

## 2022-03-22 VITALS — BP 107/70 | HR 81 | Ht 66.0 in | Wt 186.7 lb

## 2022-03-22 DIAGNOSIS — M25561 Pain in right knee: Secondary | ICD-10-CM | POA: Insufficient documentation

## 2022-03-22 NOTE — Progress Notes (Signed)
    Procedures performed today:    None.  Independent interpretation of notes and tests performed by another provider:   None.  Brief History, Exam, Impression, and Recommendations:    BP 107/70 (BP Location: Left Arm, Patient Position: Sitting, Cuff Size: Large)   Pulse 81   Ht '5\' 6"'$  (1.676 m)   Wt 186 lb 11.2 oz (84.7 kg)   LMP 02/24/2022   SpO2 100%   BMI 30.13 kg/m   Right knee pain Patient had recent onset of knee pain which started about 2 weeks ago.  She did present to urgent care for further evaluation.  She indicates that she did not have any known trauma to the knee with onset of symptoms.  Pain is primarily over anterior aspect of right knee diffusely.  She has been elevating the knee, however has not utilized any specific measures for pain control, no OTC medications, no topical therapies.  She has had some swelling with pain.  Some occasional feelings of instability.  Pain will be worse with activity such as walking.  Has had some episodes of knee pain in the past and swelling, she denies any specific prior trauma or injuries On exam, Right knee: No obvious deformity. No effusion.  Positive patellar grind.  Negative crepitus.  Patient with diffuse tenderness to palpation about the knee along anterior joint line, patellar tendon, medial aspect of patella Full ROM for flexion and extension.  Strength 5 out of 5 for flexion and extension. Lachman: Negative Varus stress test: Negative Valgus stress test: Negative McMurray's: Pain with this, no clicking Thessaly: Pain elicited Neurovascularly intact.  No evidence of lymphatic disease.  Uncertain etiology at this time, possibly related to patellofemoral pain syndrome.  No ligamentous abnormality on exam today, no significant effusion noted.  Given atraumatic symptoms, we can proceed with initial x-ray imaging.  Discussed recommendation for conservative measures including OTC medications, elevation, topical therapies, referral  to physical therapy.  She would be amenable to referral to PT, referral placed today Discussed that if x-ray imaging is normal, would recommend proceeding with conservative measures and following up in about 6 to 8 weeks to assess progress.  If symptoms are persisting or if any worsening is noted, likely would proceed with MRI of right knee to further evaluate for any underlying structural abnormality  Return in about 2 months (around 05/22/2022) for knee pain.   ___________________________________________ Shriley Joffe [place], MD, ABFM, [facility] and [place]

## 2022-03-22 NOTE — Assessment & Plan Note (Signed)
Patient had recent onset of knee pain which started about 2 weeks ago.  She did present to urgent care for further evaluation.  She indicates that she did not have any known trauma to the knee with onset of symptoms.  Pain is primarily over anterior aspect of right knee diffusely.  She has been elevating the knee, however has not utilized any specific measures for pain control, no OTC medications, no topical therapies.  She has had some swelling with pain.  Some occasional feelings of instability.  Pain will be worse with activity such as walking.  Has had some episodes of knee pain in the past and swelling, she denies any specific prior trauma or injuries On exam, Right knee: No obvious deformity. No effusion.  Positive patellar grind.  Negative crepitus.  Patient with diffuse tenderness to palpation about the knee along anterior joint line, patellar tendon, medial aspect of patella Full ROM for flexion and extension.  Strength 5 out of 5 for flexion and extension. Lachman: Negative Varus stress test: Negative Valgus stress test: Negative McMurray's: Pain with this, no clicking Thessaly: Pain elicited Neurovascularly intact.  No evidence of lymphatic disease.  Uncertain etiology at this time, possibly related to patellofemoral pain syndrome.  No ligamentous abnormality on exam today, no significant effusion noted.  Given atraumatic symptoms, we can proceed with initial x-ray imaging.  Discussed recommendation for conservative measures including OTC medications, elevation, topical therapies, referral to physical therapy.  She would be amenable to referral to PT, referral placed today Discussed that if x-ray imaging is normal, would recommend proceeding with conservative measures and following up in about 6 to 8 weeks to assess progress.  If symptoms are persisting or if any worsening is noted, likely would proceed with MRI of right knee to further evaluate for any underlying structural abnormality

## 2022-04-30 ENCOUNTER — Ambulatory Visit
Admission: EM | Admit: 2022-04-30 | Discharge: 2022-04-30 | Disposition: A | Discharge status: Home / Self Care | Payer: PRIVATE HEALTH INSURANCE | Attending: Family Medicine | Admitting: Family Medicine

## 2022-04-30 ENCOUNTER — Ambulatory Visit (HOSPITAL_COMMUNITY)
Admission: EM | Admit: 2022-04-30 | Discharge: 2022-04-30 | Discharge status: Against Medical Advice | Payer: PRIVATE HEALTH INSURANCE

## 2022-04-30 DIAGNOSIS — L03113 Cellulitis of right upper limb: Secondary | ICD-10-CM | POA: Diagnosis not present

## 2022-04-30 MED ORDER — MUPIROCIN 2 % EX OINT: 1.0000 | TOPICAL_OINTMENT | Freq: Two times a day (BID) | CUTANEOUS | 0 refills | Status: DC

## 2022-04-30 MED ORDER — CEPHALEXIN 500 MG PO CAPS
500.0000 mg | ORAL_CAPSULE | Freq: Three times a day (TID) | ORAL | 0 refills | Status: AC
Start: 2022-04-30 — End: 2022-05-05

## 2022-04-30 NOTE — ED Triage Notes (Signed)
Pt c/o skin lesion to right thumb onset ~ 2 weeks ago. Says at some point puss has discharged from it though that is not present today. Denies pain says it itches.

## 2022-04-30 NOTE — ED Provider Notes (Signed)
[facility]    CSN: [id number] Arrival date & time: 04/30/22  1219      History   Chief Complaint Chief Complaint  Patient presents with   right thumb skin lesion    HPI Brandy Morse is a 22 y.o. female.   HPI here for pain and swelling in her right thumb.  Right after she got back from vacation in the mountains of [place], she had a scratch on her right thumb overlying her MCP.  It then swelled and has had some pus coming out of it.  Yesterday it was when it was actually the most swollen, and that is improved a little bit today.  There is no pus coming out of it today.  History reviewed. No pertinent past medical history.  Patient Active Problem List   Diagnosis Date Noted   Right knee pain 03/22/2022   Strep throat 05/10/2021   Annual physical exam 10/12/2020    Past Surgical History:  Procedure Laterality Date   TONSILLECTOMY AND ADENOIDECTOMY Bilateral 07/28/2021   Procedure: TONSILLECTOMY ADENOIDECTOMY;  Surgeon: Osborn Coho, MD;  Location: [facility];  Service: ENT;  Laterality: Bilateral;    OB History   No obstetric history on file.      Home Medications    Prior to Admission medications   Medication Sig Start Date End Date Taking? Authorizing Provider  cephALEXin (KEFLEX) 500 MG capsule Take 1 capsule (500 mg total) by mouth 3 (three) times daily for 5 days. 04/30/22 05/05/22 Yes Alphonsine Minium, Janace Aris, MD  mupirocin ointment (BACTROBAN) 2 % Apply 1 Application topically 2 (two) times daily. To affected area till better 04/30/22  Yes Renne Cornick, Janace Aris, MD    Family History Family History  Problem Relation Age of Onset   Multiple sclerosis Father     Social History Social History   Tobacco Use   Smoking status: Never   Smokeless tobacco: Never  Vaping Use   Vaping Use: Never used  Substance Use Topics   Alcohol use: Not Currently   Drug use: Never     Allergies   Patient has no known allergies.   Review of  Systems Review of Systems   Physical Exam Triage Vital Signs ED Triage Vitals [04/30/22 1414]  Enc Vitals Group     BP 126/75     Pulse Rate 73     Resp 16     Temp 98 F (36.7 C)     Temp Source Oral     SpO2 98 %     Weight      Height      Head Circumference      Peak Flow      Pain Score 0     Pain Loc      Pain Edu?      Excl. in GC?    No data found.  Updated Vital Signs BP 126/75 (BP Location: Right Arm)   Pulse 73   Temp 98 F (36.7 C) (Oral)   Resp 16   SpO2 98%   Visual Acuity Right Eye Distance:   Left Eye Distance:   Bilateral Distance:    Right Eye Near:   Left Eye Near:    Bilateral Near:     Physical Exam Vitals reviewed.  Constitutional:      General: She is not in acute distress.    Appearance: She is not ill-appearing, toxic-appearing or diaphoretic.  Skin:    Coloration: Skin is not jaundiced or  pale.     Comments: There is an erythematous fairly flat lesion overlying the MCP of the right thumb.  It is about 1 cm in diameter and there is a little eschar in the center now.  There is a little swelling of the proximal thumb and some mild erythema there also.  Capillary refill is normal distally  Neurological:     General: No focal deficit present.     Mental Status: She is oriented to person, place, and time.  Psychiatric:        Behavior: Behavior normal.      UC Treatments / Results  Labs (all labs ordered are listed, but only abnormal results are displayed) Labs Reviewed - No data to display  EKG   Radiology No results found.  Procedures Procedures (including critical care time)  Medications Ordered in UC Medications - No data to display  Initial Impression / Assessment and Plan / UC Course  I have reviewed the triage vital signs and the nursing notes.  Pertinent labs & imaging results that were available during my care of the patient were reviewed by me and considered in my medical decision making (see chart for  details).        Keflex is sent in to treat the skin infection and mupirocin is also sent in to apply topically. Final Clinical Impressions(s) / UC Diagnoses   Final diagnoses:  Cellulitis of right upper extremity     Discharge Instructions      Take cephalexin 500 mg--1 capsule 3 times daily for 5 days  Put mupirocin ointment on the sore areas twice daily until improved       ED Prescriptions     Medication Sig Dispense Auth. Provider   cephALEXin (KEFLEX) 500 MG capsule Take 1 capsule (500 mg total) by mouth 3 (three) times daily for 5 days. 15 capsule Zenia Resides, MD   mupirocin ointment (BACTROBAN) 2 % Apply 1 Application topically 2 (two) times daily. To affected area till better 22 g Marlinda Mike Janace Aris, MD      PDMP not reviewed this encounter.   Zenia Resides, MD 04/30/22 [id number]

## 2022-04-30 NOTE — Discharge Instructions (Addendum)
Take cephalexin 500 mg--1 capsule 3 times daily for 5 days  Put mupirocin ointment on the sore areas twice daily until improved

## 2022-04-30 NOTE — ED Notes (Signed)
Brandy Morse, patient access reports patient is going to another [facility] location for treatment.

## 2022-05-24 ENCOUNTER — Emergency Department (HOSPITAL_BASED_OUTPATIENT_CLINIC_OR_DEPARTMENT_OTHER)
Admission: EM | Admit: 2022-05-24 | Discharge: 2022-05-24 | Disposition: A | Discharge status: Home / Self Care | Payer: PRIVATE HEALTH INSURANCE | Attending: Emergency Medicine | Admitting: Emergency Medicine

## 2022-05-24 ENCOUNTER — Other Ambulatory Visit: Payer: Self-pay

## 2022-05-24 ENCOUNTER — Encounter (HOSPITAL_BASED_OUTPATIENT_CLINIC_OR_DEPARTMENT_OTHER): Payer: Self-pay

## 2022-05-24 DIAGNOSIS — S060X0A Concussion without loss of consciousness, initial encounter: Secondary | ICD-10-CM | POA: Insufficient documentation

## 2022-05-24 DIAGNOSIS — S0990XA Unspecified injury of head, initial encounter: Secondary | ICD-10-CM | POA: Diagnosis present

## 2022-05-24 DIAGNOSIS — W500XXA Accidental hit or strike by another person, initial encounter: Secondary | ICD-10-CM | POA: Insufficient documentation

## 2022-05-24 LAB — CBC
HCT: 38.9 % (ref 36.0–46.0)
Hemoglobin: 12.9 g/dL (ref 12.0–15.0)
MCH: 29.1 pg (ref 26.0–34.0)
MCHC: 33.2 g/dL (ref 30.0–36.0)
MCV: 87.8 fL (ref 80.0–100.0)
Platelets: 257 10*3/uL (ref 150–400)
RBC: 4.43 MIL/uL (ref 3.87–5.11)
RDW: 13.8 % (ref 11.5–15.5)
WBC: 6.6 10*3/uL (ref 4.0–10.5)
nRBC: 0 % (ref 0.0–0.2)

## 2022-05-24 LAB — COMPREHENSIVE METABOLIC PANEL
ALT: 8 U/L (ref 0–44)
AST: 11 U/L — ABNORMAL LOW (ref 15–41)
Albumin: 4 g/dL (ref 3.5–5.0)
Alkaline Phosphatase: 37 U/L — ABNORMAL LOW (ref 38–126)
Anion gap: 7 (ref 5–15)
BUN: 11 mg/dL (ref 6–20)
CO2: 25 mmol/L (ref 22–32)
Calcium: 9 mg/dL (ref 8.9–10.3)
Chloride: 106 mmol/L (ref 98–111)
Creatinine, Ser: 0.97 mg/dL (ref 0.44–1.00)
GFR, Estimated: >60 mL/min (ref >60)
Glucose, Bld: 95 mg/dL (ref 70–99)
Potassium: 4.2 mmol/L (ref 3.5–5.1)
Sodium: 138 mmol/L (ref 135–145)
Total Bilirubin: 0.5 mg/dL (ref 0.3–1.2)
Total Protein: 6.9 g/dL (ref 6.5–8.1)

## 2022-05-24 LAB — HCG, SERUM, QUALITATIVE: Preg, Serum: NEGATIVE

## 2022-05-24 LAB — LIPASE, BLOOD: Lipase: 12 U/L (ref 11–51)

## 2022-05-24 MED ORDER — ETODOLAC 400 MG PO TABS: 400.0000 mg | ORAL_TABLET | Freq: Two times a day (BID) | ORAL | 0 refills | Status: AC

## 2022-05-24 MED ORDER — KETOROLAC TROMETHAMINE 15 MG/ML IJ SOLN
15.0000 mg | Freq: Once | INTRAMUSCULAR | Status: AC
  Administered 2022-05-24: 15 mg via INTRAMUSCULAR
  Filled 2022-05-24: qty 1

## 2022-05-24 MED ORDER — PROCHLORPERAZINE EDISYLATE 10 MG/2ML IJ SOLN
10.0000 mg | Freq: Once | INTRAMUSCULAR | Status: AC
  Administered 2022-05-24: 10 mg via INTRAMUSCULAR
  Filled 2022-05-24: qty 2

## 2022-05-24 MED ORDER — ONDANSETRON 4 MG PO TBDP: 4.0000 mg | ORAL_TABLET | Freq: Three times a day (TID) | ORAL | 0 refills | Status: DC | PRN

## 2022-05-24 NOTE — Discharge Instructions (Signed)
Your exam today was overall reassuring.  No concerning findings on exam.  Your blood work was also reassuring.  You likely have a concussion.  This typically last about 7 days.  Have given information to concussion clinic if your symptoms persist beyond the timeline.  Give any concerning symptoms such as persistent vomiting without being able to keep any food or drink down, change in vision, severe headache please return to the emergency department for evaluation.

## 2022-05-24 NOTE — ED Notes (Signed)
Pt given discharge instructions and reviewed prescriptions. Opportunities given for questions. Pt verbalizes understanding. Primitivo Merkey R, RN 
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
## 2022-05-29 ENCOUNTER — Ambulatory Visit (HOSPITAL_BASED_OUTPATIENT_CLINIC_OR_DEPARTMENT_OTHER): Payer: 59 | Admitting: Family Medicine

## 2022-08-26 ENCOUNTER — Ambulatory Visit
Admission: EM | Admit: 2022-08-26 | Discharge: 2022-08-26 | Disposition: A | Discharge status: Home / Self Care | Payer: PRIVATE HEALTH INSURANCE | Attending: Physician Assistant | Admitting: Physician Assistant

## 2022-08-26 ENCOUNTER — Encounter: Payer: Self-pay | Admitting: Emergency Medicine

## 2022-08-26 ENCOUNTER — Other Ambulatory Visit: Payer: Self-pay

## 2022-08-26 DIAGNOSIS — S46812A Strain of other muscles, fascia and tendons at shoulder and upper arm level, left arm, initial encounter: Secondary | ICD-10-CM | POA: Diagnosis not present

## 2022-08-26 MED ORDER — CYCLOBENZAPRINE HCL 10 MG PO TABS
10.0000 mg | ORAL_TABLET | Freq: Two times a day (BID) | ORAL | 0 refills | Status: DC | PRN
Start: 2022-08-26 — End: 2023-01-28

## 2022-08-26 MED ORDER — PREDNISONE 20 MG PO TABS: 40.0000 mg | ORAL_TABLET | Freq: Every day | ORAL | 0 refills | Status: AC

## 2022-08-26 NOTE — ED Notes (Signed)
Patient was discharged by this nurse 

## 2022-08-26 NOTE — ED Triage Notes (Signed)
Left upper back pain.  Patient woke with pain.  Denies injury.  Patient is in the area for a funeral and has been playing with cousins .  Reports she can lift left arm, but when moving left arm across chest to the right side of body, she has significant pain .  Left radial pulse 2 +.  Hands are equally warm.  Patient is right handed.

## 2022-08-26 NOTE — ED Provider Notes (Signed)
[facility]    CSN: [id number] Arrival date & time: 08/26/22  1456      History   Chief Complaint Chief Complaint  Patient presents with   Shoulder Pain    HPI Brandy Morse is a 22 y.o. female.   Patient here today for evaluation of left upper back pain that started this morning.  She denies any injury.  She states that she has been playing with her cousins but is unaware of any time she hurt her shoulder.  She notes that pain is present if she lifts her left arm above shoulder height.  She has had spasms.  She denies any numbness or tingling.  She has not taken medication for symptoms.  The history is provided by the patient.    History reviewed. No pertinent past medical history.  Patient Active Problem List   Diagnosis Date Noted   Right knee pain 03/22/2022   Strep throat 05/10/2021   Annual physical exam 10/12/2020    Past Surgical History:  Procedure Laterality Date   TONSILLECTOMY AND ADENOIDECTOMY Bilateral 07/28/2021   Procedure: TONSILLECTOMY ADENOIDECTOMY;  Surgeon: Osborn Coho, MD;  Location: [facility];  Service: ENT;  Laterality: Bilateral;    OB History   No obstetric history on file.      Home Medications    Prior to Admission medications   Medication Sig Start Date End Date Taking? Authorizing Provider  cyclobenzaprine (FLEXERIL) 10 MG tablet Take 1 tablet (10 mg total) by mouth 2 (two) times daily as needed for muscle spasms. 08/26/22  Yes Tomi Bamberger, PA-C  predniSONE (DELTASONE) 20 MG tablet Take 2 tablets (40 mg total) by mouth daily with breakfast for 5 days. 08/26/22 08/31/22 Yes Tomi Bamberger, PA-C    Family History Family History  Problem Relation Age of Onset   Multiple sclerosis Father     Social History Social History   Tobacco Use   Smoking status: Never   Smokeless tobacco: Never  Vaping Use   Vaping status: Never Used  Substance Use Topics   Alcohol use: Not Currently   Drug use:  Never     Allergies   Patient has no known allergies.   Review of Systems Review of Systems  Constitutional:  Negative for chills and fever.  Eyes:  Negative for discharge and redness.  Gastrointestinal:  Negative for nausea and vomiting.  Musculoskeletal:  Positive for myalgias. Negative for arthralgias and neck pain.  Neurological:  Negative for numbness.     Physical Exam Triage Vital Signs ED Triage Vitals  Encounter Vitals Group     BP      Systolic BP Percentile      Diastolic BP Percentile      Pulse      Resp      Temp      Temp src      SpO2      Weight      Height      Head Circumference      Peak Flow      Pain Score      Pain Loc      Pain Education      Exclude from Growth Chart    No data found.  Updated Vital Signs BP 122/80 (BP Location: Right Arm)   Pulse 65   Temp 98.5 F (36.9 C) (Oral)   Resp 18   LMP 08/03/2022   SpO2 98%      Physical Exam  Vitals and nursing note reviewed.  Constitutional:      General: She is not in acute distress.    Appearance: Normal appearance. She is not ill-appearing.  HENT:     Head: Normocephalic and atraumatic.  Eyes:     Conjunctiva/sclera: Conjunctivae normal.  Cardiovascular:     Rate and Rhythm: Normal rate.  Pulmonary:     Effort: Pulmonary effort is normal. No respiratory distress.  Musculoskeletal:     Comments: Decreased range of motion of left shoulder above shoulder height due to pain.  Tenderness palpation noted diffusely to left trapezius.  No tenderness to palpation to lower cervical or thoracic spine.   Neurological:     Mental Status: She is alert.     Comments: Gross sensation intact to left fingers distally, grip strength 5 out of 5 bilaterally  Psychiatric:        Mood and Affect: Mood normal.        Behavior: Behavior normal.        Thought Content: Thought content normal.      UC Treatments / Results  Labs (all labs ordered are listed, but only abnormal results are  displayed) Labs Reviewed - No data to display  EKG   Radiology No results found.  Procedures Procedures (including critical care time)  Medications Ordered in UC Medications - No data to display  Initial Impression / Assessment and Plan / UC Course  I have reviewed the triage vital signs and the nursing notes.  Pertinent labs & imaging results that were available during my care of the patient were reviewed by me and considered in my medical decision making (see chart for details).    Will trial steroid burst and muscle relaxer for suspected muscle strain.  Advised to avoid ibuprofen or naproxen while taking steroid.  Discussed that muscle relaxer may cause drowsiness.  Recommend slow controlled stretches.  Advised follow-up if no gradual improvement with any further concerns.  Final Clinical Impressions(s) / UC Diagnoses   Final diagnoses:  Strain of left trapezius muscle, initial encounter   Discharge Instructions   None    ED Prescriptions     Medication Sig Dispense Auth. Provider   predniSONE (DELTASONE) 20 MG tablet Take 2 tablets (40 mg total) by mouth daily with breakfast for 5 days. 10 tablet Erma Pinto F, PA-C   cyclobenzaprine (FLEXERIL) 10 MG tablet Take 1 tablet (10 mg total) by mouth 2 (two) times daily as needed for muscle spasms. 20 tablet Tomi Bamberger, PA-C      PDMP not reviewed this encounter.   Tomi Bamberger, PA-C 08/26/22 1546

## 2022-09-14 ENCOUNTER — Ambulatory Visit: Payer: PRIVATE HEALTH INSURANCE
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
## 2022-12-15 ENCOUNTER — Emergency Department (HOSPITAL_BASED_OUTPATIENT_CLINIC_OR_DEPARTMENT_OTHER)
Admission: EM | Admit: 2022-12-15 | Discharge: 2022-12-16 | Disposition: A | Discharge status: Home / Self Care | Payer: Commercial Managed Care - PPO | Attending: Emergency Medicine | Admitting: Emergency Medicine

## 2022-12-15 ENCOUNTER — Encounter (HOSPITAL_BASED_OUTPATIENT_CLINIC_OR_DEPARTMENT_OTHER): Payer: Self-pay

## 2022-12-15 ENCOUNTER — Other Ambulatory Visit: Payer: Self-pay

## 2022-12-15 DIAGNOSIS — R197 Diarrhea, unspecified: Secondary | ICD-10-CM | POA: Diagnosis not present

## 2022-12-15 DIAGNOSIS — R112 Nausea with vomiting, unspecified: Secondary | ICD-10-CM | POA: Diagnosis present

## 2022-12-15 DIAGNOSIS — R509 Fever, unspecified: Secondary | ICD-10-CM | POA: Insufficient documentation

## 2022-12-15 LAB — CBC
HCT: 36.3 % (ref 36.0–46.0)
Hemoglobin: 12.3 g/dL (ref 12.0–15.0)
MCH: 30.1 pg (ref 26.0–34.0)
MCHC: 33.9 g/dL (ref 30.0–36.0)
MCV: 88.8 fL (ref 80.0–100.0)
Platelets: 235 10*3/uL (ref 150–400)
RBC: 4.09 MIL/uL (ref 3.87–5.11)
RDW: 13.8 % (ref 11.5–15.5)
WBC: 6.6 10*3/uL (ref 4.0–10.5)
nRBC: 0 % (ref 0.0–0.2)

## 2022-12-15 LAB — COMPREHENSIVE METABOLIC PANEL
ALT: 8 U/L (ref 0–44)
AST: 16 U/L (ref 15–41)
Albumin: 3.9 g/dL (ref 3.5–5.0)
Alkaline Phosphatase: 40 U/L (ref 38–126)
Anion gap: 7 (ref 5–15)
BUN: 13 mg/dL (ref 6–20)
CO2: 25 mmol/L (ref 22–32)
Calcium: 9.2 mg/dL (ref 8.9–10.3)
Chloride: 106 mmol/L (ref 98–111)
Creatinine, Ser: 0.95 mg/dL (ref 0.44–1.00)
GFR, Estimated: >60 mL/min (ref >60)
Glucose, Bld: 110 mg/dL — ABNORMAL HIGH (ref 70–99)
Potassium: 4 mmol/L (ref 3.5–5.1)
Sodium: 138 mmol/L (ref 135–145)
Total Bilirubin: 0.4 mg/dL (ref <1.2)
Total Protein: 6.7 g/dL (ref 6.5–8.1)

## 2022-12-15 LAB — LIPASE, BLOOD: Lipase: 18 U/L (ref 11–51)

## 2022-12-15 MED ORDER — LOPERAMIDE HCL 2 MG PO CAPS
4.0000 mg | ORAL_CAPSULE | Freq: Once | ORAL | Status: AC
  Administered 2022-12-15: 4 mg via ORAL
  Filled 2022-12-15: qty 2

## 2022-12-15 MED ORDER — ONDANSETRON HCL 4 MG/2ML IJ SOLN: 4.0000 mg | Freq: Once | INTRAMUSCULAR | Status: DC | PRN

## 2022-12-15 MED ORDER — METOCLOPRAMIDE HCL 5 MG/ML IJ SOLN
10.0000 mg | Freq: Once | INTRAMUSCULAR | Status: AC
  Administered 2022-12-15: 10 mg via INTRAVENOUS
  Filled 2022-12-15: qty 2

## 2022-12-15 MED ORDER — LACTATED RINGERS IV BOLUS
1000.0000 mL | Freq: Once | INTRAVENOUS | Status: AC
  Administered 2022-12-16: 1000 mL via INTRAVENOUS

## 2022-12-15 NOTE — ED Triage Notes (Signed)
PT to triage c/o NVD with fever chills x 12 hours. Pt presents a/o x 4 skin warm dry, VSS NAD. Pt denies CP SOB.

## 2022-12-15 NOTE — ED Notes (Signed)
Pt unable to provide urine sample at this time 

## 2022-12-16 LAB — URINALYSIS, ROUTINE W REFLEX MICROSCOPIC
Bilirubin Urine: NEGATIVE
Glucose, UA: NEGATIVE mg/dL
Hgb urine dipstick: NEGATIVE
Ketones, ur: NEGATIVE mg/dL
Leukocytes,Ua: NEGATIVE
Nitrite: NEGATIVE
Specific Gravity, Urine: 1.03 (ref 1.005–1.030)
pH: 6 (ref 5.0–8.0)

## 2022-12-16 LAB — PREGNANCY, URINE: Preg Test, Ur: NEGATIVE

## 2022-12-16 MED ORDER — LOPERAMIDE HCL 2 MG PO CAPS: 2.0000 mg | ORAL_CAPSULE | Freq: Four times a day (QID) | ORAL | 0 refills | Status: DC | PRN

## 2022-12-16 MED ORDER — ONDANSETRON 4 MG PO TBDP: ORAL_TABLET | ORAL | 0 refills | Status: DC

## 2022-12-16 NOTE — ED Provider Notes (Signed)
[place] EMERGENCY DEPARTMENT AT [facility] Provider Note   CSN: [id number] Arrival date & time: 12/15/22  2133     History  Chief Complaint  Patient presents with   Diarrhea    Brandy Morse is a 22 y.o. female.  22 year old female presents the ER today with some type of illness.  She told triage that it was 12 hours she told me it was 2 days but then she is unclear on when things started.  Diarrhea may have started a couple days ago but is deftly been going on for the last 9 hours.  Thinks he might of gone to the bathroom about 10 times.  She states that she tried eaten some pizza after work today around 3:00 which she threw that up.  She is nauseous prior to that but no other emesis.  She states that she has a fever here.  Temperature was 98.7 and she is not sure if she has had a fever at home.  No known sick contacts or suspicious food intake.  No blood in her emesis or stool. No urinary symptoms.    Diarrhea      Home Medications Prior to Admission medications   Medication Sig Start Date End Date Taking? Authorizing Provider  loperamide (IMODIUM) 2 MG capsule Take 1 capsule (2 mg total) by mouth 4 (four) times daily as needed for diarrhea or loose stools. 12/16/22  Yes Tobe Kervin, Barbara Cower, MD  ondansetron (ZOFRAN-ODT) 4 MG disintegrating tablet 4mg  ODT q4 hours prn nausea/vomit 12/16/22  Yes Ruthann Angulo, Barbara Cower, MD  cyclobenzaprine (FLEXERIL) 10 MG tablet Take 1 tablet (10 mg total) by mouth 2 (two) times daily as needed for muscle spasms. 08/26/22   Tomi Bamberger, PA-C      Allergies    Patient has no known allergies.    Review of Systems   Review of Systems  Gastrointestinal:  Positive for diarrhea.    Physical Exam Updated Vital Signs BP 98/70   Pulse (!) 58   Temp 98.3 F (36.8 C) (Oral)   Resp 16   Wt 75.8 kg   LMP 11/20/2022   SpO2 100%   BMI 26.95 kg/m  Physical Exam Vitals and nursing note reviewed.  Constitutional:      Appearance: She is  well-developed.  HENT:     Head: Normocephalic and atraumatic.  Eyes:     Pupils: Pupils are equal, round, and reactive to light.  Cardiovascular:     Rate and Rhythm: Normal rate and regular rhythm.  Pulmonary:     Effort: No respiratory distress.     Breath sounds: No stridor.  Abdominal:     General: There is no distension.  Musculoskeletal:     Cervical back: Normal range of motion.  Skin:    General: Skin is warm and dry.  Neurological:     General: No focal deficit present.     Mental Status: She is alert.     ED Results / Procedures / Treatments   Labs (all labs ordered are listed, but only abnormal results are displayed) Labs Reviewed  COMPREHENSIVE METABOLIC PANEL - Abnormal; Notable for the following components:      Result Value   Glucose, Bld 110 (*)    All other components within normal limits  URINALYSIS, ROUTINE W REFLEX MICROSCOPIC - Abnormal; Notable for the following components:   Protein, ur TRACE (*)    Bacteria, UA RARE (*)    All other components within normal limits  LIPASE, BLOOD  PREGNANCY, URINE  CBC    EKG None  Radiology No results found.  Procedures Procedures    Medications Ordered in ED Medications  ondansetron (ZOFRAN) injection 4 mg (has no administration in time range)  metoCLOPramide (REGLAN) injection 10 mg (10 mg Intravenous Given 12/15/22 2356)  lactated ringers bolus 1,000 mL (0 mLs Intravenous Stopped 12/16/22 0150)  loperamide (IMODIUM) capsule 4 mg (4 mg Oral Given 12/15/22 2355)    ED Course/ Medical Decision Making/ A&P                                 Medical Decision Making Amount and/or Complexity of Data Reviewed Labs: ordered.  Risk Prescription drug management.  Abdomen benign. No indication for imaging at this time. Labs reassuring. Will treat symptomatically.  Symptoms improved.  Vitals persistently stable.  Stable for discharge.  Return here for new or worsening symptoms.   Final Clinical  Impression(s) / ED Diagnoses Final diagnoses:  Diarrhea, unspecified type  Nausea and vomiting, unspecified vomiting type    Rx / DC Orders ED Discharge Orders          Ordered    ondansetron (ZOFRAN-ODT) 4 MG disintegrating tablet        12/16/22 0318    loperamide (IMODIUM) 2 MG capsule  4 times daily PRN        12/16/22 0318              Tarris Delbene, Barbara Cower, MD 12/16/22 0500

## 2023-01-28 ENCOUNTER — Encounter (HOSPITAL_COMMUNITY): Payer: Self-pay

## 2023-01-28 ENCOUNTER — Ambulatory Visit (HOSPITAL_COMMUNITY)
Admission: RE | Admit: 2023-01-28 | Discharge: 2023-01-28 | Disposition: A | Discharge status: Home / Self Care | Payer: Commercial Managed Care - PPO | Source: Ambulatory Visit | Attending: Family Medicine | Admitting: Family Medicine

## 2023-01-28 VITALS — BP 118/76 | HR 86 | Temp 98.1°F | Resp 18

## 2023-01-28 DIAGNOSIS — S39012A Strain of muscle, fascia and tendon of lower back, initial encounter: Secondary | ICD-10-CM | POA: Diagnosis not present

## 2023-01-28 MED ORDER — CYCLOBENZAPRINE HCL 10 MG PO TABS: 10.0000 mg | ORAL_TABLET | Freq: Two times a day (BID) | ORAL | 0 refills | Status: DC | PRN

## 2023-01-28 MED ORDER — MELOXICAM 15 MG PO TABS: 15.0000 mg | ORAL_TABLET | Freq: Every day | ORAL | 0 refills | Status: DC

## 2023-01-28 NOTE — Discharge Instructions (Addendum)
 Please take your meloxicam daily for the next 7 days, afterwards you may take it as needed.  Please take your Flexeril twice a day as needed.  You may take it at night as it may make you little bit drowsy.
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
## 2023-02-15 ENCOUNTER — Ambulatory Visit (INDEPENDENT_AMBULATORY_CARE_PROVIDER_SITE_OTHER): Payer: Commercial Managed Care - PPO | Admitting: Family Medicine

## 2023-02-15 ENCOUNTER — Encounter (HOSPITAL_BASED_OUTPATIENT_CLINIC_OR_DEPARTMENT_OTHER): Payer: Self-pay | Admitting: Family Medicine

## 2023-02-15 VITALS — BP 111/76 | HR 71 | Temp 98.0°F | Ht 65.0 in | Wt 187.4 lb

## 2023-02-15 DIAGNOSIS — Z0000 Encounter for general adult medical examination without abnormal findings: Secondary | ICD-10-CM

## 2023-02-15 DIAGNOSIS — Z118 Encounter for screening for other infectious and parasitic diseases: Secondary | ICD-10-CM

## 2023-02-15 DIAGNOSIS — Z113 Encounter for screening for infections with a predominantly sexual mode of transmission: Secondary | ICD-10-CM

## 2023-02-15 DIAGNOSIS — Z124 Encounter for screening for malignant neoplasm of cervix: Secondary | ICD-10-CM | POA: Diagnosis not present

## 2023-02-15 DIAGNOSIS — L709 Acne, unspecified: Secondary | ICD-10-CM | POA: Insufficient documentation

## 2023-02-15 MED ORDER — BENZOYL PEROXIDE 2.5 % EX CREA: 1.0000 | TOPICAL_CREAM | Freq: Every day | CUTANEOUS | 1 refills | Status: AC

## 2023-02-15 NOTE — Patient Instructions (Signed)
  Medication Instructions:  Your physician recommends that you continue on your current medications as directed. Please refer to the Current Medication list given to you today. --If you need a refill on any your medications before your next appointment, please call your pharmacy first. If no refills are authorized on file call the office.-- Lab Work: Your physician has recommended that you have lab work today: today If you have labs (blood work) drawn today and your tests are completely normal, you will receive your results via MyChart message OR a phone call from our staff.  Please ensure you check your voicemail in the event that you authorized detailed messages to be left on a delegated number. If you have any lab test that is abnormal or we need to change your treatment, we will call you to review the results.   Follow-Up: Your next appointment:   Your physician recommends that you schedule a follow-up appointment in: 1 year physical with Dr. de [place]  You will receive a text message or e-mail with a link to a survey about your care and experience with [place] today! We would greatly appreciate your feedback!   Thanks for letting us be apart of your health journey!!  Primary Care and Sports Medicine   Dr. Ceasar Mons [place]   We encourage you to activate your patient portal called "MyChart".  Sign up information is provided on this After Visit Summary.  MyChart is used to connect with patients for Virtual Visits (Telemedicine).  Patients are able to view lab/test results, encounter notes, upcoming appointments, etc.  Non-urgent messages can be sent to your provider as well. To learn more about what you can do with MyChart, please visit --  [email or web address].

## 2023-02-15 NOTE — Assessment & Plan Note (Signed)
We will proceed with STI screening today.  Patient denies any symptoms at this time.

## 2023-02-15 NOTE — Progress Notes (Signed)
Subjective:    CC: Annual Physical Exam  HPI:  Brandy Morse is a 23 y.o. presenting for annual physical  I reviewed the past medical history, family history, social history, surgical history, and allergies today and no changes were needed.  Please see the problem list section below in epic for further details.  Past Medical History: History reviewed. No pertinent past medical history. Past Surgical History: Past Surgical History:  Procedure Laterality Date   TONSILLECTOMY AND ADENOIDECTOMY Bilateral 07/28/2021   Procedure: TONSILLECTOMY ADENOIDECTOMY;  Surgeon: Osborn Coho, MD;  Location: [facility];  Service: ENT;  Laterality: Bilateral;   Social History: Social History   Socioeconomic History   Marital status: Single    Spouse name: Not on file   Number of children: Not on file   Years of education: Not on file   Highest education level: Not on file  Occupational History   Not on file  Tobacco Use   Smoking status: Never   Smokeless tobacco: Never  Vaping Use   Vaping status: Never Used  Substance and Sexual Activity   Alcohol use: Not Currently   Drug use: Never   Sexual activity: Not Currently    Birth control/protection: None  Other Topics Concern   Not on file  Social History Narrative   Not on file   Social Drivers of Health   Financial Resource Strain: Not on file  Food Insecurity: Not on file  Transportation Needs: Not on file  Physical Activity: Not on file  Stress: Not on file  Social Connections: Not on file   Family History: Family History  Problem Relation Age of Onset   Multiple sclerosis Father    Allergies: No Known Allergies Medications: See med rec.  Review of Systems: No headache, visual changes, nausea, vomiting, diarrhea, constipation, dizziness, abdominal pain, skin rash, fevers, chills, night sweats, swollen lymph nodes, weight loss, chest pain, body aches, joint swelling, muscle aches, shortness of breath,  mood changes, visual or auditory hallucinations.  Objective:    BP 111/76 (BP Location: Right Arm, Patient Position: Sitting, Cuff Size: Large)   Pulse 71   Temp 98 F (36.7 C) (Oral)   Ht 5\' 5"  (1.651 m)   Wt 187 lb 6.4 oz (85 kg)   LMP 02/06/2023 (Exact Date)   SpO2 98%   BMI 31.18 kg/m   General: Well Developed, well nourished, and in no acute distress. Neuro: Alert and oriented x3, extra-ocular muscles intact, sensation grossly intact. Cranial nerves II through XII are intact, motor, sensory, and coordinative functions are all intact. HEENT: Normocephalic, atraumatic, pupils equal round reactive to light, neck supple, no masses, no lymphadenopathy, thyroid nonpalpable. Oropharynx, nasopharynx, external ear canals are unremarkable. Skin: Warm and dry, no rashes noted. Cardiac: Regular rate and rhythm, no murmurs rubs or gallops. Respiratory: Clear to auscultation bilaterally. Not using accessory muscles, speaking in full sentences. Abdominal: Soft, nontender, nondistended, positive bowel sounds, no masses, no organomegaly. Musculoskeletal: Shoulder, elbow, wrist, hip, knee, ankle stable, and with full range of motion.  Impression and Recommendations:    Cervical cancer screening -     Ambulatory referral to Obstetrics / Gynecology  Routine screening for STI (sexually transmitted infection) Assessment & Plan: We will proceed with STI screening today.  Patient denies any symptoms at this time.  Orders: -     RPR -     HIV Antibody (routine testing w rflx)  Wellness examination Assessment & Plan: Routine HCM labs ordered. HCM reviewed/discussed. Anticipatory guidance  regarding healthy weight, lifestyle and choices given. Recommend healthy diet.  Recommend approximately 150 minutes/week of moderate intensity exercise Recommend regular dental and vision exams Always use seatbelt/lap and shoulder restraints Recommend using smoke alarms and checking batteries at least twice a  year Recommend using sunscreen when outside Discussed tetanus immunization recommendations, patient will consider  Orders: -     CBC with Differential/Platelet -     Comprehensive metabolic panel -     Hemoglobin A1c -     Lipid panel -     TSH Rfx on Abnormal to Free T4  Encounter for screening examination for chlamydial infection -     Chlamydia/GC NAA, Confirmation  Acne, unspecified acne type Assessment & Plan: Patient has questions about acne management.  She indicates that she has tried various over-the-counter measures, but has not had significant relief.  She does feel that acne has worsened more recently.  On exam, comedones noted, no pustules observed.  No scarring. 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  Given reproductive concerns, would initially start with benzyl peroxide, instructed on proper use, discussed cautions related to skin irritation, sun exposure, staining of hair and fabrics.  If not responding as expected, consider alternative topical agents, consider referral to dermatology   Other orders -     Benzoyl Peroxide; Apply 1 application  topically daily.  Dispense: 21 g; Refill: 1  Return in about 1 year (around 02/15/2024), or if symptoms worsen or fail to improve, for CPE.   ___________________________________________ Ananda Sitzer de [place], MD, ABFM, CAQSM Primary Care and Sports Medicine [facility]

## 2023-02-15 NOTE — Assessment & Plan Note (Signed)
Patient has questions about acne management.  She indicates that she has tried various over-the-counter measures, but has not had significant relief.  She does feel that acne has worsened more recently.  On exam, comedones noted, no pustules observed.  No scarring. 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  Given reproductive concerns, would initially start with benzyl peroxide, instructed on proper use, discussed cautions related to skin irritation, sun exposure, staining of hair and fabrics.  If not responding as expected, consider alternative topical agents, consider referral to dermatology

## 2023-02-15 NOTE — Assessment & Plan Note (Signed)
 Routine HCM labs ordered. HCM reviewed/discussed. Anticipatory guidance regarding healthy weight, lifestyle and choices given. Recommend healthy diet.  Recommend approximately 150 minutes/week of moderate intensity exercise Recommend regular dental and vision exams Always use seatbelt/lap and shoulder restraints Recommend using smoke alarms and checking batteries at least twice a year Recommend using sunscreen when outside Discussed tetanus immunization recommendations, patient will consider

## 2023-02-16 LAB — LIPID PANEL
Chol/HDL Ratio: 2.1 {ratio} (ref 0.0–4.4)
Cholesterol, Total: 129 mg/dL (ref 100–199)
HDL: 61 mg/dL (ref >39)
LDL Chol Calc (NIH): 56 mg/dL (ref 0–99)
Triglycerides: 57 mg/dL (ref 0–149)
VLDL Cholesterol Cal: 12 mg/dL (ref 5–40)

## 2023-02-16 LAB — CBC WITH DIFFERENTIAL/PLATELET
Basophils Absolute: 0 10*3/uL (ref 0.0–0.2)
Basos: 1 %
EOS (ABSOLUTE): 0.1 10*3/uL (ref 0.0–0.4)
Eos: 1 %
Hematocrit: 36.4 % (ref 34.0–46.6)
Hemoglobin: 11.9 g/dL (ref 11.1–15.9)
Immature Grans (Abs): 0 10*3/uL (ref 0.0–0.1)
Immature Granulocytes: 0 %
Lymphocytes Absolute: 1.4 10*3/uL (ref 0.7–3.1)
Lymphs: 23 %
MCH: 29 pg (ref 26.6–33.0)
MCHC: 32.7 g/dL (ref 31.5–35.7)
MCV: 89 fL (ref 79–97)
Monocytes Absolute: 0.4 10*3/uL (ref 0.1–0.9)
Monocytes: 6 %
Neutrophils Absolute: 4.4 10*3/uL (ref 1.4–7.0)
Neutrophils: 69 %
Platelets: 235 10*3/uL (ref 150–450)
RBC: 4.1 x10E6/uL (ref 3.77–5.28)
RDW: 13.4 % (ref 11.7–15.4)
WBC: 6.3 10*3/uL (ref 3.4–10.8)

## 2023-02-16 LAB — COMPREHENSIVE METABOLIC PANEL
ALT: 14 [IU]/L (ref 0–32)
AST: 17 [IU]/L (ref 0–40)
Albumin: 4.2 g/dL (ref 4.0–5.0)
Alkaline Phosphatase: 56 [IU]/L (ref 44–121)
BUN/Creatinine Ratio: 11 (ref 9–23)
BUN: 10 mg/dL (ref 6–20)
Bilirubin Total: 0.4 mg/dL (ref 0.0–1.2)
CO2: 23 mmol/L (ref 20–29)
Calcium: 9 mg/dL (ref 8.7–10.2)
Chloride: 104 mmol/L (ref 96–106)
Creatinine, Ser: 0.9 mg/dL (ref 0.57–1.00)
Globulin, Total: 2.1 g/dL (ref 1.5–4.5)
Glucose: 78 mg/dL (ref 70–99)
Potassium: 4 mmol/L (ref 3.5–5.2)
Sodium: 142 mmol/L (ref 134–144)
Total Protein: 6.3 g/dL (ref 6.0–8.5)
eGFR: 93 mL/min/{1.73_m2} (ref >59)

## 2023-02-16 LAB — HEMOGLOBIN A1C
Est. average glucose Bld gHb Est-mCnc: 111 mg/dL
Hgb A1c MFr Bld: 5.5 % (ref 4.8–5.6)

## 2023-02-16 LAB — HIV ANTIBODY (ROUTINE TESTING W REFLEX): HIV Screen 4th Generation wRfx: NONREACTIVE

## 2023-02-16 LAB — RPR: RPR Ser Ql: NONREACTIVE

## 2023-02-16 LAB — TSH RFX ON ABNORMAL TO FREE T4: TSH: 0.799 u[IU]/mL (ref 0.450–4.500)

## 2023-02-19 LAB — CHLAMYDIA/GC NAA, CONFIRMATION
Chlamydia trachomatis, NAA: NEGATIVE
Neisseria gonorrhoeae, NAA: NEGATIVE

## 2023-02-22 ENCOUNTER — Encounter (HOSPITAL_BASED_OUTPATIENT_CLINIC_OR_DEPARTMENT_OTHER): Payer: Self-pay | Admitting: Family Medicine

## 2023-05-20 ENCOUNTER — Ambulatory Visit: Payer: Self-pay

## 2023-05-20 NOTE — Telephone Encounter (Signed)
 Chief Complaint: Rash with welts on back and buttocks  Symptoms: Welt rash with pain and raised up redness Frequency: since last night Pertinent Negatives: Patient denies throat swelling/itchiness Disposition: [] ED /[x] Urgent Care (no appt availability in office) / [] Appointment(In office/virtual)/ []  [place] [facility]/ [] Home Care/ [] Refused Recommended Disposition /[] [place] [facility]/ []  Follow-up with PCP Additional Notes: Patient called in stating she is experiencing a rash with painful welts on her buttocks and back. Patient states she's had similar reactions before but none this bad, and is uncertain the cause/source. Patient states the spots are painful to the touch. Patient states it's too many spots to count but they are red, raised, and painful. Patient advised to be seen in UC today (lack of availability for appts in office).    Reason for Disposition  [1] Purple or blood-colored rash (spots or dots) AND [2] no fever AND [3] sounds well to triager  Answer Assessment - Initial Assessment Questions 1. APPEARANCE of RASH: "Describe the rash." (e.g., spots, blisters, raised areas, skin peeling, scaly)     Welts 2. SIZE: "How big are the spots?" (e.g., tip of pen, eraser, coin; inches, centimeters)     Too many to count 3. LOCATION: "Where is the rash located?"     Buttcheeks and back 4. COLOR: "What color is the rash?" (Note: It is difficult to assess rash color in people with darker-colored skin. When this situation occurs, simply ask the caller to describe what they see.)     Red 5. ONSET: "When did the rash begin?"     Last night 6. FEVER: "Do you have a fever?" If Yes, ask: "What is your temperature, how was it measured, and when did it start?"     No 7. ITCHING: "Does the rash itch?" If Yes, ask: "How bad is the itch?" (Scale 1-10; or mild, moderate, severe)     10 8. CAUSE: "What do you think is causing the rash?"     Patient states it's happened in past but  uncertain what's causing it 9. MEDICINE FACTORS: "Have you started any new medicines within the last 2 weeks?" (e.g., antibiotics)      No 10. OTHER SYMPTOMS: "Do you have any other symptoms?" (e.g., dizziness, headache, sore throat, joint pain)       No 11. PREGNANCY: "Is there any chance you are pregnant?" "When was your last menstrual period?"       No  Protocols used: Rash or Redness - [facility]

## 2023-05-21 ENCOUNTER — Encounter: Payer: Self-pay | Admitting: Emergency Medicine

## 2023-05-21 ENCOUNTER — Ambulatory Visit
Admission: EM | Admit: 2023-05-21 | Discharge: 2023-05-21 | Disposition: A | Discharge status: Home / Self Care | Attending: Internal Medicine | Admitting: Internal Medicine

## 2023-05-21 DIAGNOSIS — L239 Allergic contact dermatitis, unspecified cause: Secondary | ICD-10-CM | POA: Diagnosis not present

## 2023-05-21 MED ORDER — PREDNISONE 20 MG PO TABS: 40.0000 mg | ORAL_TABLET | Freq: Every day | ORAL | 0 refills | Status: AC

## 2023-05-21 MED ORDER — CETIRIZINE HCL 10 MG PO TABS: 10.0000 mg | ORAL_TABLET | Freq: Every day | ORAL | 0 refills | Status: AC

## 2023-05-21 MED ORDER — FAMOTIDINE 20 MG PO TABS: 20.0000 mg | ORAL_TABLET | Freq: Every day | ORAL | 0 refills | Status: DC

## 2023-05-21 NOTE — ED Provider Notes (Addendum)
 [facility]    CSN: [id number] Arrival date & time: 05/21/23  1011      History   Chief Complaint Chief Complaint  Patient presents with   Rash    HPI Brandy Morse is a 23 y.o. female.   Brandy Morse is a 23 y.o. female presenting for chief complaint of rash to the outer right upper thigh/buttocks that started 2 days ago and has now spread to the left buttock. She describes rash as hives and welts that are very itchy. She denies recent known exposure to new personal hygiene products, laundry detergents, bedding, or soaps. No recent sick contacts with similar rash, denies exposure to poisonous plants. No rash to other areas of the body, only localized to the bilateral buttocks. Denies recent fevers, chills, N/V/D, body aches, sore throat, and concern for STD.  She has had hives in the past to the arms when she switched her laundry detergent from gain to tide. She has been using gain for the last several months without allergic reaction.  She denies recent antibiotic/steroid use and has not attempted use of any OTC medications to help with rash PTA.    Rash   History reviewed. No pertinent past medical history.  Patient Active Problem List   Diagnosis Date Noted   Routine screening for STI (sexually transmitted infection) 02/15/2023   Acne 02/15/2023   Right knee pain 03/22/2022   Strep throat 05/10/2021   Wellness examination 10/12/2020   Acute recurrent streptococcal tonsillitis 06/07/2020    Past Surgical History:  Procedure Laterality Date   TONSILLECTOMY AND ADENOIDECTOMY Bilateral 07/28/2021   Procedure: TONSILLECTOMY ADENOIDECTOMY;  Surgeon: Ammon Bales, MD;  Location: [facility];  Service: ENT;  Laterality: Bilateral;    OB History   No obstetric history on file.      Home Medications    Prior to Admission medications   Medication Sig Start Date End Date Taking? Authorizing Provider  cetirizine (ZYRTEC ALLERGY) 10 MG tablet  Take 1 tablet (10 mg total) by mouth daily for 7 days. 05/21/23 05/28/23 Yes Starlene Eaton, FNP  famotidine (PEPCID) 20 MG tablet Take 1 tablet (20 mg total) by mouth daily for 7 days. 05/21/23 05/28/23 Yes StanhopeDanny Dye, FNP  predniSONE  (DELTASONE ) 20 MG tablet Take 2 tablets (40 mg total) by mouth daily with breakfast for 5 days. 05/21/23 05/26/23 Yes Starlene Eaton, FNP  Benzoyl Peroxide  2.5 % CREA Apply 1 application  topically daily. 02/15/23   de [place], Alonza Jansky, MD  cyclobenzaprine  (FLEXERIL ) 10 MG tablet Take 1 tablet (10 mg total) by mouth 2 (two) times daily as needed for muscle spasms. Patient not taking: Reported on 02/15/2023 01/28/23   Jha, Panav, MD  meloxicam  (MOBIC ) 15 MG tablet Take 1 tablet (15 mg total) by mouth daily. Patient not taking: Reported on 02/15/2023 01/28/23   Jude Norton, MD    Family History Family History  Problem Relation Age of Onset   Multiple sclerosis Father     Social History Social History   Tobacco Use   Smoking status: Never   Smokeless tobacco: Never  Vaping Use   Vaping status: Never Used  Substance Use Topics   Alcohol use: Not Currently   Drug use: Never     Allergies   Patient has no known allergies.   Review of Systems Review of Systems  Skin:  Positive for rash.  Per HPI   Physical Exam Triage Vital Signs ED Triage Vitals  Encounter  Vitals Group     BP 05/21/23 1015 118/76     Systolic BP Percentile --      Diastolic BP Percentile --      Pulse Rate 05/21/23 1015 69     Resp 05/21/23 1015 16     Temp 05/21/23 1015 98 F (36.7 C)     Temp Source 05/21/23 1015 Oral     SpO2 05/21/23 1015 97 %     Weight --      Height --      Head Circumference --      Peak Flow --      Pain Score 05/21/23 1018 3     Pain Loc --      Pain Education --      Exclude from Growth Chart --    No data found.  Updated Vital Signs BP 118/76 (BP Location: Right Arm)   Pulse 69   Temp 98 F (36.7 C) (Oral)   Resp 16    LMP 05/03/2023 (Approximate)   SpO2 97%   Visual Acuity Right Eye Distance:   Left Eye Distance:   Bilateral Distance:    Right Eye Near:   Left Eye Near:    Bilateral Near:     Physical Exam Vitals and nursing note reviewed.  Constitutional:      Appearance: She is not ill-appearing or toxic-appearing.  HENT:     Head: Normocephalic and atraumatic.     Right Ear: Hearing and external ear normal.     Left Ear: Hearing and external ear normal.     Nose: Nose normal.     Mouth/Throat:     Lips: Pink.     Mouth: Mucous membranes are moist.     Pharynx: No posterior oropharyngeal erythema.  Eyes:     General: Lids are normal. Vision grossly intact. Gaze aligned appropriately.     Extraocular Movements: Extraocular movements intact.     Conjunctiva/sclera: Conjunctivae normal.  Pulmonary:     Effort: Pulmonary effort is normal.  Musculoskeletal:     Cervical back: Neck supple.  Skin:    General: Skin is warm and dry.     Capillary Refill: Capillary refill takes less than 2 seconds.     Findings: Rash (Diffuse hives to the bilateral buttocks and to the right upper outer thigh. No other lesions to the body, rash is localized to the bilateral buttocks and the outer right upper thigh.) present.  Neurological:     General: No focal deficit present.     Mental Status: She is alert and oriented to person, place, and time. Mental status is at baseline.     Cranial Nerves: No dysarthria or facial asymmetry.  Psychiatric:        Mood and Affect: Mood normal.        Speech: Speech normal.        Behavior: Behavior normal.        Thought Content: Thought content normal.        Judgment: Judgment normal.      UC Treatments / Results  Labs (all labs ordered are listed, but only abnormal results are displayed) Labs Reviewed - No data to display  EKG   Radiology No results found.  Procedures Procedures (including critical care time)  Medications Ordered in UC Medications  - No data to display  Initial Impression / Assessment and Plan / UC Course  I have reviewed the triage vital signs and the nursing notes.  Pertinent  labs & imaging results that were available during my care of the patient were reviewed by me and considered in my medical decision making (see chart for details).   1. Allergic contact dermatitis, unspecified trigger Presentation consistent with acute hypersensitivity reaction, likely acute allergic reaction likely due to contact dermatitis.  Advised to change laundry detergents to "free and clear" and use sensitive skin personal hygiene products. No signs of anaphylaxis, HEENT exam stable, lungs clear.  Will treat with steroids, antihistamines, H2 blocker (famotidine) and supportive care as outlined in AVS.  Advised to avoid known and potential allergens. Follow-up with PCP and/or allergy specialist.    Counseled patient on potential for adverse effects with medications prescribed/recommended today, strict ER and return-to-clinic precautions discussed, patient verbalized understanding.    Final Clinical Impressions(s) / UC Diagnoses   Final diagnoses:  Allergic contact dermatitis, unspecified trigger     Discharge Instructions      Take prednisone  40mg  (2- 20 mg pills) once daily for the next 5 days to treat allergic reaction.  Take zyrtec 10mg  once daily for 7 days.  Pepcid 20mg  once daily for 7 days may also help with allergic reaction.  Schedule a follow-up appointment with [facility] Allergy Specialist listed on your paperwork.  Change your laundry detergent to free and clear detergent (no dyes or scents).  Use sensitive skin body wash.  If you develop any new or worsening symptoms or if your symptoms do not start to improve, please return here or follow-up with your primary care provider. If your symptoms are severe, please go to the emergency room.     ED Prescriptions     Medication Sig Dispense Auth. Provider    cetirizine (ZYRTEC ALLERGY) 10 MG tablet Take 1 tablet (10 mg total) by mouth daily for 7 days. 7 tablet Starlene Eaton, FNP   famotidine (PEPCID) 20 MG tablet Take 1 tablet (20 mg total) by mouth daily for 7 days. 7 tablet Starlene Eaton, FNP   predniSONE  (DELTASONE ) 20 MG tablet Take 2 tablets (40 mg total) by mouth daily with breakfast for 5 days. 10 tablet Starlene Eaton, FNP      PDMP not reviewed this encounter.     Starlene Eaton, [place] 05/21/23 1049

## 2023-05-21 NOTE — ED Triage Notes (Signed)
 Pt presents with hives on buttocks for 2 days. Denies changes in products. Welts are itchy.

## 2023-05-21 NOTE — Discharge Instructions (Addendum)
 Take prednisone  40mg  (2- 20 mg pills) once daily for the next 5 days to treat allergic reaction.  Take zyrtec 10mg  once daily for 7 days.  Pepcid 20mg  once daily for 7 days may also help with allergic reaction.  Schedule a follow-up appointment with [facility] All[facility] Specialist listed on your paperwork.  Change your laundry detergent to free and clear detergent (no dyes or scents).  Use sensitive skin body wash.  If you develop any new or worsening symptoms or if your symptoms do not start to improve, please return here or follow-up with your primary care provider. If your symptoms are severe, please go to the emergency room.

## 2023-05-22 ENCOUNTER — Ambulatory Visit (HOSPITAL_BASED_OUTPATIENT_CLINIC_OR_DEPARTMENT_OTHER): Admitting: Family Medicine

## 2023-07-30 ENCOUNTER — Encounter (HOSPITAL_BASED_OUTPATIENT_CLINIC_OR_DEPARTMENT_OTHER): Payer: Self-pay | Admitting: Family Medicine

## 2023-07-30 ENCOUNTER — Ambulatory Visit (INDEPENDENT_AMBULATORY_CARE_PROVIDER_SITE_OTHER): Admitting: Family Medicine

## 2023-07-30 VITALS — BP 107/70 | HR 68 | Ht 66.0 in | Wt 183.4 lb

## 2023-07-30 DIAGNOSIS — Z0000 Encounter for general adult medical examination without abnormal findings: Secondary | ICD-10-CM

## 2023-07-30 DIAGNOSIS — S3992XA Unspecified injury of lower back, initial encounter: Secondary | ICD-10-CM | POA: Insufficient documentation

## 2023-07-30 DIAGNOSIS — Z820 Family history of epilepsy and other diseases of the nervous system: Secondary | ICD-10-CM | POA: Insufficient documentation

## 2023-07-30 NOTE — Progress Notes (Signed)
    Procedures performed today:    None.  Independent interpretation of notes and tests performed by another provider:   None.  Brief History, Exam, Impression, and Recommendations:    BP 107/70 (BP Location: Left Arm, Patient Position: Sitting, Cuff Size: Normal)   Pulse 68   Ht 5' 6 (1.676 m)   Wt 183 lb 6.4 oz (83.2 kg)   SpO2 100%   BMI 29.60 kg/m   Injury of back, initial encounter Assessment & Plan: Patient reports remote history of back injury when she was younger.  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  She indicates that she will have occasional flareups in pain.  Is concerned about how this could affect her back long-term.  Not currently having any increased symptoms. We discussed general recommendations.  Discussed potential role for physical therapy.  Discussed importance of home exercises, regular physical activity.  Discussed importance of core strengthening.  Maintaining optimal BMI will also be important as elevated BMI could also increase risk for back related issues.   Family history of multiple sclerosis Assessment & Plan: Patient reports that her father has diagnosis of MS.  Because of this, her mom wanted to have further evaluation and any additional testing that may be recommended. Patient currently has no symptoms which would be suggestive of multiple sclerosis.  We discussed that there is not any specific screening test.  Discussed that diagnosis of MS encompasses multiple aspects and if symptoms were to develop which may suggest onset of MS, diagnostic evaluation can be started at that time.  We discussed general risk related to MS for patients who do a family member with diagnosis of MS.  There is a slightly increased likelihood of developing MS for patients who have affected family members, although this is still generally a small risk   Patient also reports family history of cancer.  Unfortunately, she is not aware of specific types of cancers  which family members have had. We discussed considerations in this regard.  Recommend that she try to find out more information about the family where she would have been affected by cancer, types of cancer, age and diagnosis for affected family members.  Discussed that this can impact recommendations from a cancer screening standpoint.  She will look to obtain this information and will either send us  a message through MyChart once she obtains this or she may bring information with her to next office visit for us  to review  No follow-ups on file.   ___________________________________________ Brandy Parada de [place], MD, ABFM, CAQSM Primary Care and Sports Medicine [facility]

## 2023-07-30 NOTE — Assessment & Plan Note (Signed)
 Patient reports remote history of back injury when she was younger.  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  She indicates that she will have occasional flareups in pain.  Is concerned about how this could affect her back long-term.  Not currently having any increased symptoms. We discussed general recommendations.  Discussed potential role for physical therapy.  Discussed importance of home exercises, regular physical activity.  Discussed importance of core strengthening.  Maintaining optimal BMI will also be important as elevated BMI could also increase risk for back related issues.

## 2023-07-30 NOTE — Assessment & Plan Note (Signed)
 Patient reports that her father has diagnosis of MS.  Because of this, her mom wanted to have further evaluation and any additional testing that may be recommended. Patient currently has no symptoms which would be suggestive of multiple sclerosis.  We discussed that there is not any specific screening test.  Discussed that diagnosis of MS encompasses multiple aspects and if symptoms were to develop which may suggest onset of MS, diagnostic evaluation can be started at that time.  We discussed general risk related to MS for patients who do a family member with diagnosis of MS.  There is a slightly increased likelihood of developing MS for patients who have affected family members, although this is still generally a small risk

## 2023-07-30 NOTE — Patient Instructions (Signed)
  Medication Instructions:  Your physician recommends that you continue on your current medications as directed. Please refer to the Current Medication list given to you today. --If you need a refill on any your medications before your next appointment, please call your pharmacy first. If no refills are authorized on file call the office.--   Follow-Up: Your next appointment:   Your physician recommends that you schedule a follow-up appointment in: keep physical appt  with Dr. de Peru  You will receive a text message or e-mail with a link to a survey about your care and experience with us  today! We would greatly appreciate your feedback!   Thanks for letting us  be apart of your health journey!!  Primary Care and Sports Medicine   Dr. Quintin sheerer Peru   We encourage you to activate your patient portal called MyChart.  Sign up information is provided on this After Visit Summary.  MyChart is used to connect with patients for Virtual Visits (Telemedicine).  Patients are able to view lab/test results, encounter notes, upcoming appointments, etc.  Non-urgent messages can be sent to your provider as well. To learn more about what you can do with MyChart, please visit --  [email or web address].

## 2023-09-20 ENCOUNTER — Ambulatory Visit
Admission: EM | Admit: 2023-09-20 | Discharge: 2023-09-20 | Disposition: A | Discharge status: Home / Self Care | Attending: Family Medicine | Admitting: Family Medicine

## 2023-09-20 ENCOUNTER — Ambulatory Visit: Payer: Self-pay

## 2023-09-20 ENCOUNTER — Encounter: Payer: Self-pay | Admitting: Emergency Medicine

## 2023-09-20 DIAGNOSIS — S50861A Insect bite (nonvenomous) of right forearm, initial encounter: Secondary | ICD-10-CM | POA: Diagnosis not present

## 2023-09-20 DIAGNOSIS — L03113 Cellulitis of right upper limb: Secondary | ICD-10-CM | POA: Diagnosis not present

## 2023-09-20 DIAGNOSIS — W57XXXA Bitten or stung by nonvenomous insect and other nonvenomous arthropods, initial encounter: Secondary | ICD-10-CM

## 2023-09-20 MED ORDER — CEPHALEXIN 500 MG PO CAPS: 500.0000 mg | ORAL_CAPSULE | Freq: Three times a day (TID) | ORAL | 0 refills | Status: AC

## 2023-09-20 NOTE — Discharge Instructions (Addendum)
 You were seen today for an infected insect bite to the arm.  I have sent out oral antibiotic for infection of the arm.  You may use warm compresses to the area as well.  If you have worsening redness or swelling despite medication, then please return or go to the ER for further evaluation.

## 2023-09-20 NOTE — Telephone Encounter (Signed)
 FYI Only or Action Required?: FYI only for provider.  Patient was last seen in primary care on 07/30/2023 by de Peru, Quintin PARAS, MD.  Called Nurse Triage reporting Insect Bite, burning and itching, Arm Swelling, arm redness, and Recurrent Skin Infections.  Symptoms began several days ago.  Interventions attempted: Nothing.  Symptoms are: gradually worsening.  Triage Disposition: See HCP Within 4 Hours (Or PCP Triage)  Patient/caregiver understands and will follow disposition?: Yes       Copied from CRM [id number]. Topic: Clinical - Red Word Triage >> Sep 20, 2023  8:35 AM Jasmin G wrote: [facility] that prompted transfer to Nurse Triage: Pt states that she got bit by something about 2 days ago, I asked her but she was unsure of what, her arm is red and swollen with pus is coming out, accompanied by burning and itching. Pt initially called to check for any appt spots for today. Reason for Disposition  [1] SEVERE bite pain AND [2] not improved after 2 hours of pain medicine  Answer Assessment - Initial Assessment Questions 1. TYPE of INSECT: What type of insect was it?      Not sure 2. ONSET: When did you get bitten?      2 days ago, felt the bite 3. LOCATION: Where is the insect bite located?      arm 4. REDNESS: Is the area red or pink? If Yes, ask: What size is the area of redness? (inches or cm). When did the redness start?     Red, spreading 5. PAIN: Is there any pain? If Yes, ask: How bad is the pain? (Scale 0-10; or none, mild, moderate, severe)     8/10, hurts to have jacket touch it 6. ITCHING: Does it itch? If Yes, ask: How bad is the itch?      8/10 7. SWELLING: How big is the swelling? (e.g., inches, cm, or compare to coins)     Just that area, bigger than a quarter 8. OTHER SYMPTOMS: Do you have any other symptoms?  (e.g., difficulty breathing, fever, hives)     Pus coming out  Had to leave work for this    Advised exam in next 4 hours, no  PCP availability, pt wanting sooner than region availability, advised UC.  Protocols used: Insect Bite-A-AH

## 2023-09-20 NOTE — ED Triage Notes (Signed)
 Pt presents c/o bug bite on right forearm x 2 days. Pt reports she first noticed yesterday and she was thinking it was mosquito bite. Pt states she had what looked like 2 blisters that drained overnight. The blisters are now scabby and the area is swollen, tender to touch, and itching.

## 2023-09-20 NOTE — ED Provider Notes (Signed)
 [facility]    CSN: [id number] Arrival date & time: 09/20/23  0905      History   Chief Complaint Chief Complaint  Patient presents with   Insect Bite    HPI Brandy Morse is a 23 y.o. female.   Patient is here for an insect bite to the right forearm.  Noted it about two ago.  At first thought it was a mosquito bite, was itchy.  Yesterday she woke up and the arm was swollen.  Last night it started blistering with pus.  Some stinging pain.  This morning the area was swollen, red with pus.  No fevers/chills       History reviewed. No pertinent past medical history.  Patient Active Problem List   Diagnosis Date Noted   Back injury 07/30/2023   Family history of multiple sclerosis 07/30/2023   Routine screening for STI (sexually transmitted infection) 02/15/2023   Acne 02/15/2023   Right knee pain 03/22/2022   Strep throat 05/10/2021   Wellness examination 10/12/2020   Acute recurrent streptococcal tonsillitis 06/07/2020    Past Surgical History:  Procedure Laterality Date   TONSILLECTOMY AND ADENOIDECTOMY Bilateral 07/28/2021   Procedure: TONSILLECTOMY ADENOIDECTOMY;  Surgeon: Mable Lenis, MD;  Location: [facility];  Service: ENT;  Laterality: Bilateral;    OB History   No obstetric history on file.      Home Medications    Prior to Admission medications   Medication Sig Start Date End Date Taking? Authorizing Provider  Benzoyl Peroxide  2.5 % CREA Apply 1 application  topically daily. 02/15/23   de Peru, Raymond J, MD  cetirizine  (ZYRTEC  ALLERGY) 10 MG tablet Take 1 tablet (10 mg total) by mouth daily for 7 days. 05/21/23 07/30/23  Enedelia Dorna HERO, FNP    Family History Family History  Problem Relation Age of Onset   Multiple sclerosis Father     Social History Social History   Tobacco Use   Smoking status: Never    Passive exposure: Never   Smokeless tobacco: Never  Vaping Use   Vaping status: Never Used   Substance Use Topics   Alcohol use: Not Currently   Drug use: Never     Allergies   Patient has no known allergies.   Review of Systems Review of Systems  Constitutional: Negative.   HENT: Negative.    Respiratory: Negative.    Cardiovascular: Negative.   Gastrointestinal: Negative.   Skin:  Positive for color change and wound.     Physical Exam Triage Vital Signs ED Triage Vitals  Encounter Vitals Group     BP 09/20/23 1050 115/75     Girls Systolic BP Percentile --      Girls Diastolic BP Percentile --      Boys Systolic BP Percentile --      Boys Diastolic BP Percentile --      Pulse Rate 09/20/23 1050 71     Resp 09/20/23 1050 16     Temp 09/20/23 1050 98.1 F (36.7 C)     Temp Source 09/20/23 1050 Oral     SpO2 09/20/23 1050 100 %     Weight 09/20/23 1049 183 lb 6.8 oz (83.2 kg)     Height --      Head Circumference --      Peak Flow --      Pain Score 09/20/23 1048 8     Pain Loc --      Pain Education --  Exclude from Growth Chart --    No data found.  Updated Vital Signs BP 115/75 (BP Location: Left Arm)   Pulse 71   Temp 98.1 F (36.7 C) (Oral)   Resp 16   Wt 83.2 kg   LMP 09/09/2023 (Approximate)   SpO2 100%   BMI 29.61 kg/m   Visual Acuity Right Eye Distance:   Left Eye Distance:   Bilateral Distance:    Right Eye Near:   Left Eye Near:    Bilateral Near:     Physical Exam Constitutional:      Appearance: Normal appearance. She is normal weight.  Skin:    Comments: Two small, open blisters to the right forearm.  There is slight redness and swelling.  She is very TTP around both bites  Neurological:     General: No focal deficit present.     Mental Status: She is alert.  Psychiatric:        Mood and Affect: Mood normal.      UC Treatments / Results  Labs (all labs ordered are listed, but only abnormal results are displayed) Labs Reviewed - No data to display  EKG   Radiology No results  found.  Procedures Procedures (including critical care time)  Medications Ordered in UC Medications - No data to display  Initial Impression / Assessment and Plan / UC Course  I have reviewed the triage vital signs and the nursing notes.  Pertinent labs & imaging results that were available during my care of the patient were reviewed by me and considered in my medical decision making (see chart for details).   Final Clinical Impressions(s) / UC Diagnoses   Final diagnoses:  Insect bite of right forearm, initial encounter  Cellulitis of right upper extremity     Discharge Instructions      You were seen today for an infected insect bite to the arm.  I have sent out oral antibiotic for infection of the arm.  You may use warm compresses to the area as well.  If you have worsening redness or swelling despite medication, then please return or go to the ER for further evaluation.     ED Prescriptions     Medication Sig Dispense Auth. Provider   cephALEXin  (KEFLEX ) 500 MG capsule Take 1 capsule (500 mg total) by mouth 3 (three) times daily for 7 days. 21 capsule Darral Longs, MD      PDMP not reviewed this encounter.   Darral Longs, MD 09/20/23 1105

## 2023-10-31 ENCOUNTER — Ambulatory Visit

## 2023-10-31 ENCOUNTER — Ambulatory Visit
Admission: EM | Admit: 2023-10-31 | Discharge: 2023-10-31 | Disposition: A | Discharge status: Home / Self Care | Attending: Urgent Care | Admitting: Urgent Care

## 2023-10-31 DIAGNOSIS — S9031XA Contusion of right foot, initial encounter: Secondary | ICD-10-CM

## 2023-10-31 MED ORDER — IBUPROFEN 800 MG PO TABS: 800.0000 mg | ORAL_TABLET | Freq: Three times a day (TID) | ORAL | 0 refills | Status: DC

## 2023-10-31 NOTE — ED Triage Notes (Signed)
 Pt states she dropped a dresser on her right foot ~30 min PTA-no break in skin-no pain meds PTA-NAD-limping gait

## 2023-10-31 NOTE — ED Provider Notes (Signed)
 [facility]  Note:  This document was prepared using Conservation officer, historic buildings and may include unintentional dictation errors.  MRN: [id number] DOB: July 20, 2000  Subjective:   Brandy Morse is a 23 y.o. female presenting for suffering a right foot injury.  Patient accidentally dropped a dresser on her right foot 30 minutes prior to arrival in clinic.  No wounds, bleeding.  No current facility-administered medications for this encounter.  Current Outpatient Medications:    Benzoyl Peroxide  2.5 % CREA, Apply 1 application  topically daily., Disp: 21 g, Rfl: 1   cetirizine  (ZYRTEC  ALLERGY) 10 MG tablet, Take 1 tablet (10 mg total) by mouth daily for 7 days., Disp: 7 tablet, Rfl: 0   No Known Allergies  History reviewed. No pertinent past medical history.   Past Surgical History:  Procedure Laterality Date   TONSILLECTOMY AND ADENOIDECTOMY Bilateral 07/28/2021   Procedure: TONSILLECTOMY ADENOIDECTOMY;  Surgeon: Mable Lenis, MD;  Location: [facility];  Service: ENT;  Laterality: Bilateral;    Family History  Problem Relation Age of Onset   Multiple sclerosis Father     Social History   Tobacco Use   Smoking status: Never    Passive exposure: Never   Smokeless tobacco: Never  Vaping Use   Vaping status: Every Day   Substances: Nicotine  Substance Use Topics   Alcohol use: Yes    Comment: occ   Drug use: Yes    Types: Marijuana    ROS   Objective:   Vitals: BP 116/74 (BP Location: Left Arm)   Pulse 68   Temp 97.7 F (36.5 C) (Oral)   Resp 16   LMP 10/10/2023   SpO2 98%   Physical Exam Constitutional:      General: She is not in acute distress.    Appearance: Normal appearance. She is well-developed. She is not ill-appearing, toxic-appearing or diaphoretic.  HENT:     Head: Normocephalic and atraumatic.     Nose: Nose normal.     Mouth/Throat:     Mouth: Mucous membranes are moist.  Eyes:     General: No  scleral icterus.       Right eye: No discharge.        Left eye: No discharge.     Extraocular Movements: Extraocular movements intact.  Cardiovascular:     Rate and Rhythm: Normal rate.  Pulmonary:     Effort: Pulmonary effort is normal.  Musculoskeletal:       Feet:  Skin:    General: Skin is warm and dry.  Neurological:     General: No focal deficit present.     Mental Status: She is alert and oriented to person, place, and time.  Psychiatric:        Mood and Affect: Mood normal.        Behavior: Behavior normal.    DG Foot Complete Right Result Date: 10/31/2023 CLINICAL DATA:  Injury. EXAM: RIGHT FOOT COMPLETE - 3+ VIEW COMPARISON:  None Available. FINDINGS: There is no evidence of fracture or dislocation. There is no evidence of arthropathy or other focal bone abnormality. Soft tissues are unremarkable. IMPRESSION: Negative. Electronically Signed   By: Camellia Candle M.D.   On: 10/31/2023 10:37   Right foot and ankle wrapped using 4 Ace wrap in figure-8 method.   Assessment and Plan :   PDMP not reviewed this encounter.  1. Contusion of right foot, initial encounter    Recommended conservative management for right  foot contusion.  Use RICE method, ibuprofen  for pain and inflammation. Counseled patient on potential for adverse effects with medications prescribed/recommended today, ER and return-to-clinic precautions discussed, patient verbalized understanding.    Christopher Savannah, PA-C 10/31/23 1046

## 2024-01-02 ENCOUNTER — Ambulatory Visit
Admission: EM | Admit: 2024-01-02 | Discharge: 2024-01-02 | Disposition: A | Discharge status: Home / Self Care | Source: Home / Self Care

## 2024-01-02 ENCOUNTER — Telehealth: Payer: Self-pay | Admitting: Student

## 2024-01-02 ENCOUNTER — Ambulatory Visit

## 2024-01-02 DIAGNOSIS — S60021A Contusion of right index finger without damage to nail, initial encounter: Secondary | ICD-10-CM | POA: Diagnosis not present

## 2024-01-02 NOTE — Discharge Instructions (Signed)
-   Your finger x-ray looks normal to me.  I will call in 1 to 2 hours if the radiologist sees something that I did not.  If you do not get a call, the x-ray was normal.   -Finger splint as needed while pain persists -I sent a prescription for ibuprofen . You can take Tylenol  up to 1000 mg 3 times daily, and ibuprofen  up to 600 mg 3 times daily with food.  You can take these together, or alternate every 3-4 hours. -I also recommend an ice pack as needed. Make sure to put a towel between the ice pack and your skin.

## 2024-01-02 NOTE — ED Provider Notes (Signed)
 [facility]    CSN: [id number] Arrival date & time: 01/02/24  1224      History   Chief Complaint Chief Complaint  Patient presents with   Finger Injury    HPI Brandy Morse is a 23 y.o. female presenting w finger injury.  States she smashed her finger in a car door 2 weeks ago. Pt present with c/o right index finger pain and swelling X 2 weeks. Pt states the swelling has decreased. Pt reports pain during ROM. Home interventions: None  HPI  History reviewed. No pertinent past medical history.  Patient Active Problem List   Diagnosis Date Noted   Back injury 07/30/2023   Family history of multiple sclerosis 07/30/2023   Routine screening for STI (sexually transmitted infection) 02/15/2023   Acne 02/15/2023   Right knee pain 03/22/2022   Strep throat 05/10/2021   Wellness examination 10/12/2020   Acute recurrent streptococcal tonsillitis 06/07/2020    Past Surgical History:  Procedure Laterality Date   TONSILLECTOMY AND ADENOIDECTOMY Bilateral 07/28/2021   Procedure: TONSILLECTOMY ADENOIDECTOMY;  Surgeon: Mable Lenis, MD;  Location: [facility];  Service: ENT;  Laterality: Bilateral;    OB History   No obstetric history on file.      Home Medications    Prior to Admission medications  Medication Sig Start Date End Date Taking? Authorizing Provider  Benzoyl Peroxide  2.5 % CREA Apply 1 application  topically daily. 02/15/23   de [place], Raymond J, MD  cetirizine  (ZYRTEC  ALLERGY) 10 MG tablet Take 1 tablet (10 mg total) by mouth daily for 7 days. 05/21/23 07/30/23  Enedelia Dorna HERO, FNP  ibuprofen  (ADVIL ) 800 MG tablet Take 1 tablet (800 mg total) by mouth 3 (three) times daily. 01/02/24   Arlyss Leita BRAVO, PA-C    Family History Family History  Problem Relation Age of Onset   Multiple sclerosis Father     Social History Social History[1]   Allergies   Patient has no known allergies.   Review of Systems Review of Systems   Musculoskeletal:        R index finger pain     Physical Exam Triage Vital Signs ED Triage Vitals  Encounter Vitals Group     BP      Girls Systolic BP Percentile      Girls Diastolic BP Percentile      Boys Systolic BP Percentile      Boys Diastolic BP Percentile      Pulse      Resp      Temp      Temp src      SpO2      Weight      Height      Head Circumference      Peak Flow      Pain Score      Pain Loc      Pain Education      Exclude from Growth Chart    No data found.  Updated Vital Signs BP 113/73   Pulse 89   Resp 18   LMP 12/10/2023 (Exact Date)   SpO2 99%   Visual Acuity Right Eye Distance:   Left Eye Distance:   Bilateral Distance:    Right Eye Near:   Left Eye Near:    Bilateral Near:     Physical Exam Vitals reviewed.  Constitutional:      General: She is not in acute distress.    Appearance: Normal appearance. She is  not ill-appearing.  HENT:     Head: Normocephalic and atraumatic.  Pulmonary:     Effort: Pulmonary effort is normal.  Musculoskeletal:     Comments: R index finger: no skin changes or swelling. TTP over the PIP and DIP. Rom intact but with pain. Cap refill < 2 seconds. Sensation intact.   Neurological:     General: No focal deficit present.     Mental Status: She is alert and oriented to person, place, and time.  Psychiatric:        Mood and Affect: Mood normal.        Behavior: Behavior normal.        Thought Content: Thought content normal.        Judgment: Judgment normal.      UC Treatments / Results  Labs (all labs ordered are listed, but only abnormal results are displayed) Labs Reviewed - No data to display  EKG   Radiology DG Finger Index Right Result Date: 01/02/2024 CLINICAL DATA:  Right index finger pain and swelling after door injury 2 weeks ago EXAM: RIGHT INDEX FINGER 2+V COMPARISON:  None Available. FINDINGS: There is no evidence of dislocation. There is no evidence of arthropathy. Possible  nondisplaced fracture involving proximal base second distal phalanx. Soft tissues are unremarkable. IMPRESSION: Possible nondisplaced fracture involving proximal base of second distal phalanx. Electronically Signed   By: Lynwood Landy Raddle M.D.   On: 01/02/2024 14:49    Procedures Procedures (including critical care time)  Medications Ordered in UC Medications - No data to display  Initial Impression / Assessment and Plan / UC Course  I have reviewed the triage vital signs and the nursing notes.  Pertinent labs & imaging results that were available during my care of the patient were reviewed by me and considered in my medical decision making (see chart for details).     Patient is a pleasant 23 y.o. female presenting with R index finger contusion. The patient is afebrile and nontachycardic.  Antipyretic has not been administered today. States she could not be pregnant and is not breastfeeding.  Xray R index finger:  Possible nondisplaced fracture involving proximal base of second distal phalanx.  Placed in finger splint. Ibuprofen  sent. RICE.   Called patient to discuss results. She will f/u w [facility] in the next 3 days.   Final Clinical Impressions(s) / UC Diagnoses   Final diagnoses:  Contusion of right index finger without damage to nail, initial encounter     Discharge Instructions      - Your finger x-ray looks normal to me.  I will call in 1 to 2 hours if the radiologist sees something that I did not.  If you do not get a call, the x-ray was normal.   -Finger splint as needed while pain persists -I sent a prescription for ibuprofen . You can take Tylenol  up to 1000 mg 3 times daily, and ibuprofen  up to 600 mg 3 times daily with food.  You can take these together, or alternate every 3-4 hours. -I also recommend an ice pack as needed. Make sure to put a towel between the ice pack and your skin.       ED Prescriptions     Medication Sig Dispense Auth. Provider    ibuprofen  (ADVIL ) 800 MG tablet Take 1 tablet (800 mg total) by mouth 3 (three) times daily. 21 tablet Santiana Glidden E, PA-C      PDMP not reviewed this encounter.     [  1]  Social History Tobacco Use   Smoking status: Never    Passive exposure: Never   Smokeless tobacco: Never  Vaping Use   Vaping status: Every Day   Substances: Nicotine  Substance Use Topics   Alcohol use: Yes    Comment: occ   Drug use: Yes    Types: Marijuana     Arlyss Leita BRAVO, PA-C 01/02/24 1514

## 2024-01-02 NOTE — Telephone Encounter (Signed)
 Called patient to discuss her R index finger xray results. She will f/u with [facility] for possible fracture.

## 2024-01-02 NOTE — ED Triage Notes (Signed)
 Pt present with c/o right index finger pain and swelling X 2 weeks. Pt states the swelling has decreased. States she smashed her finger in a car door. Pt reports pain during ROM  Home interventions: None

## 2024-01-03 ENCOUNTER — Ambulatory Visit (HOSPITAL_COMMUNITY): Payer: Self-pay

## 2024-02-19 ENCOUNTER — Encounter (HOSPITAL_BASED_OUTPATIENT_CLINIC_OR_DEPARTMENT_OTHER): Payer: Self-pay | Admitting: Family Medicine

## 2024-02-19 ENCOUNTER — Ambulatory Visit
Admission: EM | Admit: 2024-02-19 | Discharge: 2024-02-19 | Disposition: A | Discharge status: Home / Self Care | Source: Home / Self Care

## 2024-02-19 ENCOUNTER — Ambulatory Visit (INDEPENDENT_AMBULATORY_CARE_PROVIDER_SITE_OTHER): Payer: Commercial Managed Care - PPO | Admitting: Family Medicine

## 2024-02-19 VITALS — BP 111/65 | HR 64 | Temp 97.9°F | Resp 18 | Ht 66.0 in | Wt 173.0 lb

## 2024-02-19 DIAGNOSIS — Z113 Encounter for screening for infections with a predominantly sexual mode of transmission: Secondary | ICD-10-CM

## 2024-02-19 DIAGNOSIS — Z0000 Encounter for general adult medical examination without abnormal findings: Secondary | ICD-10-CM

## 2024-02-19 NOTE — Assessment & Plan Note (Signed)

## 2024-02-19 NOTE — ED Notes (Signed)
Pt left after registration

## 2024-02-19 NOTE — Progress Notes (Signed)
 " Subjective:    CC: Annual Physical Exam  HPI: Brandy Morse is a 24 y.o. presenting for annual physical  I reviewed the past medical history, family history, social history, surgical history, and allergies today and no changes were needed.  Please see the problem list section below in epic for further details.  Past Medical History: History reviewed. No pertinent past medical history. Past Surgical History: Past Surgical History:  Procedure Laterality Date   TONSILLECTOMY AND ADENOIDECTOMY Bilateral 07/28/2021   Procedure: TONSILLECTOMY ADENOIDECTOMY;  Surgeon: Mable Lenis, MD;  Location: [facility];  Service: ENT;  Laterality: Bilateral;   Social History: Social History   Socioeconomic History   Marital status: Single    Spouse name: Not on file   Number of children: Not on file   Years of education: Not on file   Highest education level: Not on file  Occupational History   Not on file  Tobacco Use   Smoking status: Never    Passive exposure: Never   Smokeless tobacco: Never  Vaping Use   Vaping status: Every Day   Substances: Nicotine  Substance and Sexual Activity   Alcohol use: Yes    Comment: occ   Drug use: Yes    Types: Marijuana   Sexual activity: Not Currently  Other Topics Concern   Not on file  Social History Narrative   Not on file   Social Drivers of Health   Tobacco Use: Low Risk (02/19/2024)   Patient History    Smoking Tobacco Use: Never    Smokeless Tobacco Use: Never    Passive Exposure: Never  Financial Resource Strain: Not on file  Food Insecurity: Not on file  Transportation Needs: Not on file  Physical Activity: Not on file  Stress: Not on file  Social Connections: Not on file  Depression (PHQ2-9): Low Risk (02/19/2024)   Depression (PHQ2-9)    PHQ-2 Score: 0  Alcohol Screen: Not on file  Housing: Not on file  Utilities: Not on file  Health Literacy: Not on file   Family History: Family History  Problem  Relation Age of Onset   Multiple sclerosis Father    Allergies: Allergies[1] Medications: See med rec.  Review of Systems: No headache, visual changes, nausea, vomiting, diarrhea, constipation, dizziness, abdominal pain, skin rash, fevers, chills, night sweats, swollen lymph nodes, weight loss, chest pain, body aches, joint swelling, muscle aches, shortness of breath, mood changes, visual or auditory hallucinations.  Objective:    BP 111/65 (BP Location: Right Arm, Patient Position: Sitting, Cuff Size: Normal)   Pulse 64   Temp 97.9 F (36.6 C) (Oral)   Resp 18   Ht 5' 6 (1.676 m)   Wt 173 lb (78.5 kg)   SpO2 100%   BMI 27.92 kg/m   General: Well Developed, well nourished, and in no acute distress.  Neuro: Alert and oriented x3, extra-ocular muscles intact, sensation grossly intact. Cranial nerves II through XII are intact, motor, sensory, and coordinative functions are all intact. HEENT: Normocephalic, atraumatic, pupils equal round reactive to light, neck supple, no masses, no lymphadenopathy, thyroid nonpalpable. Oropharynx, nasopharynx, external ear canals are unremarkable. Skin: Warm and dry, no rashes noted.  Cardiac: Regular rate and rhythm, no murmurs rubs or gallops.  Respiratory: Clear to auscultation bilaterally. Not using accessory muscles, speaking in full sentences.  Abdominal: Soft, nontender, nondistended, positive bowel sounds, no masses, no organomegaly.  Musculoskeletal: Shoulder, elbow, wrist, hip, knee, ankle stable, and with full range of motion.  Impression and Recommendations:    Wellness examination Assessment & Plan: Routine HCM labs ordered. HCM reviewed/discussed. Anticipatory guidance regarding healthy weight, lifestyle and choices given. Recommend healthy diet.  Recommend approximately 150 minutes/week of moderate intensity exercise Recommend regular dental and vision exams Always use seatbelt/lap and shoulder restraints Recommend using smoke  alarms and checking batteries at least twice a year Recommend using sunscreen when outside Discussed immunization recommendations  Orders: -     CBC with Differential/Platelet -     Comprehensive metabolic panel with GFR -     Hemoglobin A1c -     Lipid panel -     TSH Rfx on Abnormal to Free T4  Routine screening for STI (sexually transmitted infection) -     HIV Antibody (routine testing w rflx) -     Chlamydia/GC NAA, Confirmation -     RPR W/RFLX TO RPR TITER, TREPONEMAL AB, SCREEN AND DIAGNOSIS  Return in about 1 year (around 02/18/2025) for CPE.   ___________________________________________ Royer Cristobal de [place], MD, ABFM, CAQSM Primary Care and Sports Medicine [facility] [place]    [1] No Known Allergies  "

## 2024-02-20 LAB — CHLAMYDIA/GC NAA, CONFIRMATION
Chlamydia trachomatis, NAA: NEGATIVE
Neisseria gonorrhoeae, NAA: NEGATIVE
